# Patient Record
Sex: Female | Born: 1985 | Race: Black or African American | Hispanic: Refuse to answer | Marital: Single | Smoking: Current every day smoker
Health system: Northeastern US, Community
[De-identification: ages and names within clinical notes are randomized; demographics above are authoritative.]

## PROBLEM LIST (undated history)

## (undated) DIAGNOSIS — D649 Anemia, unspecified: Secondary | ICD-10-CM

## (undated) DIAGNOSIS — Z789 Other specified health status: Secondary | ICD-10-CM

## (undated) HISTORY — PX: NO PAST SURGERIES: SHX2092

---
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
## 2003-05-11 ENCOUNTER — Ambulatory Visit: Payer: Self-pay | Admitting: Adult Health

## 2003-07-06 ENCOUNTER — Encounter: Payer: Self-pay | Admitting: Adult Health

## 2004-04-08 ENCOUNTER — Ambulatory Visit: Payer: Self-pay | Admitting: Adult Health

## 2004-04-08 LAB — BLOOD COUNT COMPLETE AUTOMATED
HEMATOCRIT: 42.6 % (ref 37.0–47.0)
HEMOGLOBIN: 14.1 g/dL (ref 12.0–16.0)
MEAN CORP HGB CONC: 33.1 g/dL (ref 32.0–36.0)
MEAN CORPUSCULAR HGB: 27 pg (ref 27.0–31.0)
MEAN CORPUSCULAR VOL: 81.6 fL (ref 81.0–99.0)
MEAN PLATELET VOLUME: 8.7 fL (ref 6.4–10.8)
PLATELET COUNT: 248 10*3/uL (ref 150–400)
RBC DISTRIBUTION WIDTH: 13.3 % (ref 11.5–14.3)
RED BLOOD CELL COUNT: 5.22 MIL/uL (ref 4.20–5.40)
WHITE BLOOD CELL COUNT: 5.5 10*3/uL (ref 4.8–10.8)

## 2004-04-08 LAB — CYTOPATH, C/V, THIN LAYER

## 2004-04-09 LAB — CHG LIPID PANEL
Cholesterol: 148 mg/dl (ref 0–200)
HIGH DENSITY LIPOPROTEIN: 59 mg/dl (ref 35–85)
LOW DENSITY LIPOPROTEIN DIRECT: 69 mg/dl (ref 0–100)
RISK FACTOR: 2.5 (ref ?–4.4)
TRIGLYCERIDES: 34 mg/dl (ref 0–150)

## 2004-04-09 LAB — IADNA NEISSERIA GONORRHOEAE DIRECT PROBE TQ: GENPROBE GC: NEGATIVE

## 2004-04-09 LAB — IADNA CHLAMYDIA TRACHOMATIS DIRECT PROBE TQ: GENPROBE CHLAMYDIA: NEGATIVE

## 2004-04-09 LAB — RPR: RPR: NONREACTIVE

## 2004-06-13 ENCOUNTER — Ambulatory Visit: Payer: Self-pay | Admitting: Adult Health

## 2004-12-09 ENCOUNTER — Emergency Department: Payer: Self-pay

## 2005-07-24 ENCOUNTER — Ambulatory Visit: Payer: Self-pay | Admitting: Family Medicine

## 2005-07-24 ENCOUNTER — Telehealth (HOSPITAL_BASED_OUTPATIENT_CLINIC_OR_DEPARTMENT_OTHER): Payer: Self-pay

## 2005-07-24 ENCOUNTER — Encounter (HOSPITAL_BASED_OUTPATIENT_CLINIC_OR_DEPARTMENT_OTHER): Payer: Self-pay | Admitting: Family Medicine

## 2005-07-24 DIAGNOSIS — Z3040 Encounter for surveillance of contraceptives, unspecified: Secondary | ICD-10-CM

## 2005-07-24 DIAGNOSIS — Z113 Encounter for screening for infections with a predominantly sexual mode of transmission: Secondary | ICD-10-CM

## 2005-07-24 DIAGNOSIS — N760 Acute vaginitis: Secondary | ICD-10-CM

## 2005-07-24 NOTE — Telephone Encounter (Signed)
Please give 5 packages of Plan B to patient. Instructions and education done by Dr. Conley Canal. Patient will pick up this evening.

## 2005-07-25 LAB — CHLAMYDIA GC NAAT
GENPROBE CHLAMYDIA: NEGATIVE
GENPROBE GC: NEGATIVE

## 2005-08-04 NOTE — Telephone Encounter (Signed)
Please sign order

## 2005-08-08 LAB — CYTOPATH, C/V, THIN LAYER

## 2007-07-08 ENCOUNTER — Ambulatory Visit: Payer: Self-pay

## 2007-07-08 DIAGNOSIS — Z139 Encounter for screening, unspecified: Secondary | ICD-10-CM

## 2007-07-08 NOTE — Progress Notes (Signed)
Labs drawn

## 2007-07-09 NOTE — Progress Notes (Signed)
Pt arrives asking for immune status to be filled out for school.  Unable to document all that is required.  Immunes for MMR_Varicella-Heb b drawn.  Will make f/u appt for results,tb testing & PE

## 2007-07-13 ENCOUNTER — Encounter: Payer: Self-pay | Admitting: Family Medicine

## 2007-07-13 ENCOUNTER — Ambulatory Visit: Payer: Self-pay | Admitting: Family Medicine

## 2007-07-13 VITALS — BP 110/70 | HR 90 | Temp 96.8°F | Ht 65.0 in | Wt 226.0 lb

## 2007-07-13 DIAGNOSIS — Z111 Encounter for screening for respiratory tuberculosis: Secondary | ICD-10-CM

## 2007-07-13 DIAGNOSIS — Z1331 Encounter for screening for depression: Secondary | ICD-10-CM

## 2007-07-13 DIAGNOSIS — Z124 Encounter for screening for malignant neoplasm of cervix: Secondary | ICD-10-CM

## 2007-07-13 DIAGNOSIS — Z113 Encounter for screening for infections with a predominantly sexual mode of transmission: Secondary | ICD-10-CM

## 2007-07-13 DIAGNOSIS — Z0000 Encounter for general adult medical examination without abnormal findings: Secondary | ICD-10-CM

## 2007-07-13 DIAGNOSIS — L723 Sebaceous cyst: Secondary | ICD-10-CM

## 2007-07-13 DIAGNOSIS — L089 Local infection of the skin and subcutaneous tissue, unspecified: Secondary | ICD-10-CM

## 2007-07-13 MED ORDER — CEPHALEXIN 250 MG PO CAPS
ORAL_CAPSULE | ORAL | Status: AC
Start: 2007-07-13 — End: 2007-08-10

## 2007-07-13 NOTE — Progress Notes (Signed)
Patient stated that she feels safe at home.     Patient's method of birth control: condoms

## 2007-07-13 NOTE — Progress Notes (Signed)
SUBJECTIVE:  Cynthia Mcdonald is a 22 year old female who presents for a physical exam.       Awaiting lab results re need for any vaccinations- not all ready now.    ? Infected cyst upper abdomen for a long time- no pain/fever        Family History    Diabetes Maternal Grandmother    Heart Maternal Grandmother         Social History   Marital Status: Single  Spouse Name: N/A    Years of Education: 14  Number of Children: 0     Occupational History  Haematologist       Social History Main Topics   Tobacco Use: Yes     Comment: one pack a week    Alcohol Use: No    Drug Use: No    Sexually Active: Not Currently     Other Topics Concern    Exercise Yes    Comment: yoga 4 times a week     Social History Narrative    Grew in [place], grad [employer], lives with MGM, hopes to go to nursing school       No current outpatient prescriptions on file.    Obstetric History    G1  P0  T0  P0  A1  E0  M0  L0       REVIEW OF SYSTEMS:  No TIA's or unusual headaches, no dysphagia.  No prolonged cough. No dyspnea or chest pain on exertion.  No abdominal pain, change in bowel habits, black or bloody stools.  No urinary tract symptoms.  No new or unusual musculoskeletal symptoms.  Normal menses, no abnormal vaginal bleeding, discharge or unexpected pelvic pain. No new breast lumps, breast pain or nipple discharge.    OBJECTIVE:  The physical exam is generally normal. Patient appears well, alert and oriented x 3, pleasant, cooperative. Vitals are as noted. Neck supple and free of adenopathy, or masses. No thyromegaly. PERLA. Ears, throat are normal. Chest is clear to IPPA. Heart sounds are normal, no murmurs, clicks, gallops or rubs. Abdomen is soft, no tenderness, masses or organomegaly.  Breasts: normal without suspicious masses, skin or nipple changes or axillary nodes, self-exam is taught and encouraged. Self exam is encouraged. Pelvis: exam chaperoned by nurse, EGBUS within normal limits, normal cervix without lesions,  polyps or tenderness, uterus normal size, shape, consistency, no mass or tenderness, adnexa normal in size without mass or tenderness. Exam chaperoned. Extremities are normal. Peripheral pulses are normal. Screening neurological exam is normal without focal findings. Skin is normal without suspicious lesions noted ecept for chronic appearing cyst on upper abdomen, just below rib margin- draining small amounts of pus under presure, no erethema. Small cysts in skin surrounding    V70.0 Routine General Medical Examination at a Health Care Facility  (primary encounter diagnosis)  Comment: as above  Plan:  Needs past immun review    V74.1K Screening for Tuberculosis  Comment:  As needed  Plan: TB INTRADERMAL TEST            V76.2 Screening for Malignant Neoplasm of the Cervix  Comment: last done i 2007  Plan: CYTOPATH, C/V, THIN LAYER            V74.5A Screening for Venereal Disease  Comment: per routine  Plan: AMPLIFIED GENPROBE CHLAM/GC           V79.0 Screening for Depression  Comment:  PHQ-9-- 0  Plan:  reassured  706.2CD Infected Sebaceous Cyst  Comment:  Drained with local pressure  Plan: try Keflex for a week, may need excision in future

## 2007-07-13 NOTE — Progress Notes (Signed)
S:  Cynthia Mcdonald is here for a PPD plant.  1)  Has your arm ever turned red, bumpy, swollen or sore after receiving a PPD test for TB? No  2)  Have you ever had the disease Tuberculosis? No  3)  Have you traveled outside the country over the past 2 years? No  4)  Are you taking steroid medication? No  5)  Has there been any cancer or illnesses resulting in a decreased immune system recently? No  6)  Have you ever received BCG? No  6)  ROS of TB symptoms: None      O:  PPD planted.  0.1cc Mantox ID, left forearm.    A:  PPD plant, screen TB.    P:  Pt advised to return in 48-72 hours for appointment to interpret PPD results.       Scheduled in a nursing visit for follow up and documentation of completion.

## 2007-07-14 LAB — RUBEOLA IGG ANTIBODY: RUBEOLA IGG ANTIBODY: >4.5 — ABNORMAL HIGH (ref 0.0–0.8)

## 2007-07-14 LAB — VARICELLA ZOSTER IGG ANTIBODY: VARICELLA ZOSTER IGG ANTIBODY: >4.2 — ABNORMAL HIGH (ref 0.0–0.8)

## 2007-07-14 LAB — MUMPS IGG ANTIBODY: MUMPS IGG ANTIBODY: 3.5 — ABNORMAL HIGH (ref 0.0–0.8)

## 2007-07-14 LAB — HEPATITIS B SURFACE ANTIBODY: HEPATITIS B SURFACE ANTIBODY: REACTIVE

## 2007-07-14 LAB — HEPATITIS B SURFACE ANTIGEN: HEPATITIS B SURFACE ANTIGEN: NONREACTIVE

## 2007-07-14 LAB — RUBELLA IGG ANTIBODY: RUBELLA: 19 IU/mL — ABNORMAL HIGH (ref 0–15)

## 2007-07-15 ENCOUNTER — Ambulatory Visit: Payer: Self-pay | Admitting: Family Medicine

## 2007-07-15 DIAGNOSIS — Z111 Encounter for screening for respiratory tuberculosis: Secondary | ICD-10-CM

## 2007-07-15 LAB — SKIN TEST TUBERCULOSIS INTRADERMAL: PPD: 0

## 2007-07-15 NOTE — Progress Notes (Signed)
Cynthia Mcdonald is here for a PPD read.  PPD planted left forearm on 07/13/07.  No erthyema, 0mm induration left forearm. Negative PPD read.  F/u as scheduled with provider.

## 2007-07-16 ENCOUNTER — Encounter: Payer: Self-pay | Admitting: Family Medicine

## 2007-07-16 LAB — CHLAMYDIA GC NAAT
GENPROBE CHLAMYDIA: NEGATIVE
GENPROBE GC: NEGATIVE

## 2007-07-19 LAB — CYTOPATH, C/V, THIN LAYER

## 2007-09-24 ENCOUNTER — Ambulatory Visit (HOSPITAL_BASED_OUTPATIENT_CLINIC_OR_DEPARTMENT_OTHER): Payer: PRIVATE HEALTH INSURANCE | Admitting: "Women's Health Care

## 2007-09-24 ENCOUNTER — Ambulatory Visit (HOSPITAL_BASED_OUTPATIENT_CLINIC_OR_DEPARTMENT_OTHER): Payer: Self-pay | Admitting: Lab

## 2007-09-24 VITALS — BP 120/84

## 2007-09-24 DIAGNOSIS — IMO0001 Reserved for inherently not codable concepts without codable children: Secondary | ICD-10-CM

## 2007-09-24 NOTE — Progress Notes (Signed)
Pt was sent to [facility] by her "old high school, nurse".  She needs emergency contraception after condom broke 48hours ago.  Pt is due now for her period and in fact got period today.  She still wants plan B.  Declines family planning offer, she is not interested in the weight gain that goes along with most BCP.  She is aware of YAZ and its hopeful decrease in weight gain.  If interested she will call her PCP, Dr Jari Favre for [facility].  Will do pregnancy test if missses next menses as plan B is not 100%.

## 2007-12-15 ENCOUNTER — Encounter (HOSPITAL_BASED_OUTPATIENT_CLINIC_OR_DEPARTMENT_OTHER): Payer: Self-pay | Admitting: Family Medicine

## 2008-10-30 ENCOUNTER — Encounter (HOSPITAL_BASED_OUTPATIENT_CLINIC_OR_DEPARTMENT_OTHER): Payer: Self-pay

## 2008-10-30 ENCOUNTER — Ambulatory Visit (HOSPITAL_BASED_OUTPATIENT_CLINIC_OR_DEPARTMENT_OTHER): Payer: HMO

## 2008-10-30 VITALS — BP 110/80 | Wt 191.0 lb

## 2008-10-30 DIAGNOSIS — Z3009 Encounter for other general counseling and advice on contraception: Secondary | ICD-10-CM

## 2008-10-30 LAB — URINE PREGNANCY TEST (POINT OF CARE): HCG QUALITATIVE URINE: NEGATIVE

## 2008-10-30 MED ORDER — NEXT CHOICE 0.75 MG PO TABS
ORAL_TABLET | ORAL | Status: AC
Start: 2008-10-30 — End: 2008-10-30

## 2008-10-30 NOTE — Progress Notes (Signed)
.  Emergency Contraception Visit    History:           Date of last menstrual period: Patient's last menstrual period was 10/20/2008.                      Was it a normal period (flow, duration, time etc): Yes    Date and time of last unprotected intercourse: 10/28/2008 - condom broke.     Any unprotected intercourse more than 72 hours ago? No.                 Using any kind of contraception?  Yes. What types? condoms and n/a                       Urine pregnancy test(if done):  Negative    Eligible for [facility]?:  Yes    Education/Counseling Provided on:    -Emergency contraception (how it works, effectiveness, risks/benefits/side effects,  alternatives, instructions for use, warning signs of problems)  -Informed consent signed  -Written information on EC provided  -Contraception  -STD/HIV  prevention/safer sex and risk assessment  -Follow-up discussed (must be seen if): next period has not started within 3  weeks of EC, think might be pregnant, if abdominal pain (EC does not prevent ectopic pregnancy)    Other comments/plans:  Pt is here for Plan B after condom broke during intercourse on 10/28/2008. Pt and partner of 2 years use condoms during every sexual act. Pt also interested in discussing other forms of BC but requests only low or no hormone forms. Pt has previosuly used Depo and OCP with hormonal side effects of weight gain and severe mood swings. Counselor discussed IUD, NuvaRing and low dose OCP. Pt is interested in Mirena IUD.     DV assessment completed and pt reports being very happy and safe in relationship with no abuse, fear or coercion.     Counselor discussed plan with Dr. Zonia Kief who OK'd distribution of two packs of Next Choice Plan B. 1 for use today and one for future. Pt signed Family Planning and EC consent forms today. Written information and condoms distributed along with clinic numbers. Marland Kitchen       Thea Gist                     FP Counselor Signature

## 2013-10-11 ENCOUNTER — Encounter: Payer: Self-pay | Admitting: Family Medicine

## 2017-03-13 ENCOUNTER — Encounter (HOSPITAL_BASED_OUTPATIENT_CLINIC_OR_DEPARTMENT_OTHER): Payer: Self-pay

## 2017-03-13 ENCOUNTER — Emergency Department (HOSPITAL_BASED_OUTPATIENT_CLINIC_OR_DEPARTMENT_OTHER)
Admission: RE | Admit: 2017-03-13 | Disposition: A | Discharge status: Home / Self Care | Payer: Self-pay | Source: Emergency Department | Attending: Emergency Medicine | Admitting: Emergency Medicine

## 2017-03-13 MED ORDER — PENICILLIN V POTASSIUM 500 MG PO TABS: 500 mg | tablet | Freq: Three times a day (TID) | ORAL | 0 refills | 0 days | Status: AC

## 2017-03-13 MED ORDER — ACETAMINOPHEN 500 MG PO TABS
1000.0000 mg | ORAL_TABLET | Freq: Once | ORAL | Status: AC
Start: 2017-03-13 — End: 2017-03-13
  Administered 2017-03-13: 1000 mg via ORAL

## 2017-03-13 MED ORDER — PENICILLIN V POTASSIUM 500 MG PO TABS
500.0000 mg | ORAL_TABLET | Freq: Once | ORAL | Status: AC
  Administered 2017-03-13: 500 mg via ORAL
  Filled 2017-03-13: qty 1

## 2017-03-13 MED ORDER — PENICILLIN V POTASSIUM 500 MG PO TABS
500.00 mg | ORAL_TABLET | Freq: Three times a day (TID) | ORAL | 0 refills | Status: AC
Start: 2017-03-13 — End: 2017-03-20

## 2017-03-13 MED ORDER — ACETAMINOPHEN 500 MG PO TABS
ORAL_TABLET | ORAL | Status: DC
Start: 2017-03-13 — End: 2017-03-14
  Filled 2017-03-13: qty 2

## 2017-03-13 NOTE — ED Provider Notes (Signed)
ED nursing record was reviewed. Prior records as available electronically through the Epic record were reviewed.      HPI:    This 31 year old female patient presents to the Emergency Department with chief complaint of tooth pain and facial swelling.  For the past few weeks patient has had pain in her right lower jaw.  She has seen 3 dentist which told her that she needed to see a different specialist to extract her tooth.  Earlier today she started noticing some swelling.  No fevers.  She took Aleve 6 hours ago.  No other complaints.      ROS: Pertinent positives were reviewed as per the HPI above. All other systems were reviewed and are negative.      Past Medical History/Problem list:  History reviewed. No pertinent past medical history.  Patient Active Problem List:     Infected sebaceous cyst     Family planning              Past Surgical History: History reviewed. No pertinent surgical history.      Medications:   No current facility-administered medications on file prior to encounter.   No current outpatient medications on file prior to encounter.      Social History:   Social History     Socioeconomic History    Marital status: Single     Spouse name: Not on file    Number of children: 0    Years of education: 14    Highest education level: Not on file   Social Needs    Financial resource strain: Not on file    Food insecurity - worry: Not on file    Food insecurity - inability: Not on file    Transportation needs - medical: Not on file    Transportation needs - non-medical: Not on file   Occupational History    Occupation: medical  assitant student     Employer: [employer]   Tobacco Use    Smoking status: Current Every Day Smoker    Tobacco comment: one pack a week   Substance and Sexual Activity    Alcohol use: No    Drug use: No    Sexual activity: Not Currently   Other Topics Concern    Military Service Not Asked    Blood Transfusions Not Asked    Caffeine Concern Not Asked     Occupational Exposure Not Asked    Hobby Hazards Not Asked    Sleep Concern Not Asked    Stress Concern Not Asked    Weight Concern Not Asked    Special Diet Not Asked    Back Care Not Asked    Exercise Yes     Comment: yoga 4 times a week    Bike Helmet Not Asked    Seat Belt Not Asked    Self-Exams Not Asked   Social History Narrative    Grew in [place], grad [employer], lives with MGM, hopes to go to nursing school           Allergies:  Review of Patient's Allergies indicates:  No Known Allergies      Physical Exam:  BP 148/63  Pulse 86  Temp 97.8 F  Resp 17  Wt 110.2 kg (243 lb)  LMP 02/27/2017  SpO2 99%  BMI 40.44 kg/m2    GENERAL: No acute distress, non-toxic.   SKIN:  Warm & Dry, no rash.  HEAD:  NCAT. Sclerae are anicteric and  aninjected.  Mouth: Tenderness and mild swelling in right lower gums.  No palpable abscess     NECK:  Supple.  No meningismus.    LUNGS:  Clear to auscultation bilaterally.   HEART:  RRR.   ABDOMEN:  Soft, NTND.  No involuntary guarding or rebound.   EXTREMITIES:  No obvious deformities.   NEUROLOGIC:  Alert, moves all extremities well; speaking clearly.   PSYCHIATRIC:  Appropriate for age, time of day, and situation        ED Course and Medical Decision-making:  This 31 year old female patient presents with pain and swelling in her right jaw.  Likely dental infection.  No obvious abscess to drain.  She was given a dose of penicillin and a prescription.  She was encouraged to follow-up with a dentist as soon as possible.  Reasons to return were discussed.      Condition: Stable  Disposition: discharge      Diagnosis/Diagnoses:  Dental abscess    Marylouise Stacks, MD

## 2017-03-13 NOTE — ED Triage Note (Signed)
Patient presents to the ER complaining of right lower dental pain. "It's my back tooth." "It's been going on for two weeks." patient states she still has her wisdom teeth. Some right sided facial swelling noted. No acute distress.

## 2017-03-13 NOTE — Narrator Note (Signed)
Patient examined by MD. Patient medicated per MD order. Patient discharged and was given script and discharge instructions. Patient left to care of self with no new complaints.

## 2017-03-13 NOTE — Narrator Note (Signed)
Patient Disposition    Patient education for diagnosis, medications, activity, diet and follow-up.  Patient left ED 10:51 PM.  Patient rep received written instructions.  Interpreter to provide instructions: No    Patient belongings with patient: YES    Have all existing LDAs been addressed? N/A    Have all IV infusions been stopped? N/A    Discharged to: Discharged to home.

## 2018-04-20 DIAGNOSIS — Z6841 Body Mass Index (BMI) 40.0 and over, adult: Secondary | ICD-10-CM

## 2019-05-27 NOTE — L&D Delivery Note (Addendum)
LABOR COURSE 34 yo G2P0010 @ [redacted]w[redacted]d presenting for SOL. SROM @ 0315 light meconium. Patient augmented with pitocin and progressed to complete without complication.   Delivery Note Called to room and patient was complete and pushing. Head delivered Middle Occiput Anterior. No nuchal cord present. Shoulder and body delivered in usual fashion. At 1536 a healthy female was delivered via Vaginal, Spontaneous.  Infant with spontaneous cry, placed on mother's abdomen, dried and stimulated. Cord clamped x 2 after 1-minute delay, and cut by FOB. Cord blood drawn. Placenta delivered spontaneously with gentle cord traction. Appears intact. Fundus firm with massage and Pitocin. Labia, perineum, vagina, and cervix inspected with 2nd degree perineal laceration. Repaired with 3.0 vicryl.  APGAR:9,9 ; weight 3385g .   Cord: 3VC, membranes intact.    Anesthesia: Epidural Episiotomy: None Lacerations: 2nd degre Suture Repair: 3.0 vicryl Est. Blood Loss (mL): 300  Mom to postpartum.  Baby to Couplet care / Skin to Skin.  Betti Cruz, MD PGY-1 05/01/2020 5:50 PM     GME ATTESTATION:  I saw and evaluated the patient, I was gowned and gloved during the entirety of delivery. I agree with the findings and the plan of care as documented in the resident's note.  Alric Seton, MD OB Fellow, Faculty [facility], Center for [facility] 05/01/2020 6:31 PM

## 2019-10-19 ENCOUNTER — Ambulatory Visit (INDEPENDENT_AMBULATORY_CARE_PROVIDER_SITE_OTHER): Payer: Medicaid Other

## 2019-10-19 ENCOUNTER — Other Ambulatory Visit: Payer: Self-pay

## 2019-10-19 VITALS — BP 103/68 | HR 73 | Ht 66.0 in | Wt 223.0 lb

## 2019-10-19 DIAGNOSIS — Z3201 Encounter for pregnancy test, result positive: Secondary | ICD-10-CM

## 2019-10-19 DIAGNOSIS — Z3490 Encounter for supervision of normal pregnancy, unspecified, unspecified trimester: Secondary | ICD-10-CM | POA: Insufficient documentation

## 2019-10-19 LAB — POCT URINE PREGNANCY: Preg Test, Ur: POSITIVE — AB

## 2019-10-19 MED ORDER — BLOOD PRESSURE KIT DEVI
1.0000 | 0 refills | Status: DC
Start: 2019-10-19 — End: 2020-10-04

## 2019-10-19 NOTE — Progress Notes (Signed)
Traci Ramirez presents today for UPT. She has no unusual complaints.  LMP: 07/25/2019  EDD: 04/30/2020  [redacted]w[redacted]d    OBJECTIVE: Appears well, in no apparent distress.  OB History    Gravida  1   Para      Term      Preterm      AB      Living        SAB      TAB      Ectopic      Multiple      Live Births             Home UPT Result: POSITIVE In-Office UPT result: POSITIVE  I have reviewed the patient's medical, obstetrical, social, and family histories, and medications.   ASSESSMENT: Positive pregnancy test  PLAN Prenatal care to be completed at: [facility]

## 2019-11-01 ENCOUNTER — Ambulatory Visit (HOSPITAL_BASED_OUTPATIENT_CLINIC_OR_DEPARTMENT_OTHER): Payer: Medicaid Other

## 2019-11-01 DIAGNOSIS — Z3490 Encounter for supervision of normal pregnancy, unspecified, unspecified trimester: Secondary | ICD-10-CM

## 2019-11-01 DIAGNOSIS — Z3A00 Weeks of gestation of pregnancy not specified: Secondary | ICD-10-CM | POA: Diagnosis not present

## 2019-11-01 NOTE — Progress Notes (Signed)
I connected with Traci Ramirez on 11/01/19 at  9:00 AM EDT by a telephone enabled telemedicine application and verified that I am speaking with the correct person using two identifiers.    I discussed the limitations of evaluation and management by telemedicine and the availability of in person appointments. The patient expressed understanding and agreed to proceed.  OB History  Gravida Para Term Preterm AB Living  2       1    SAB TAB Ectopic Multiple Live Births  1            # Outcome Date GA Lbr Len/2nd Weight Sex Delivery Anes PTL Lv  2 Current           1 SAB 02/23/19            Assessment and Plan:  Normal Pregnacy   Follow Up Instructions:  11/08/19  I provided 10 minutes of non-face-to-face time during this encounter.

## 2019-11-01 NOTE — Progress Notes (Signed)
I have reviewed this chart and agree with the RN/CMA assessment and management.    K. Meryl Nixon Sparr, M.D. Attending [facility] (Faculty Practice)   

## 2019-11-08 ENCOUNTER — Other Ambulatory Visit: Payer: Self-pay

## 2019-11-08 ENCOUNTER — Encounter: Payer: Self-pay | Admitting: Obstetrics & Gynecology

## 2019-11-08 ENCOUNTER — Ambulatory Visit (INDEPENDENT_AMBULATORY_CARE_PROVIDER_SITE_OTHER): Payer: Medicaid Other | Admitting: Obstetrics & Gynecology

## 2019-11-08 ENCOUNTER — Other Ambulatory Visit (HOSPITAL_COMMUNITY)
Admission: RE | Admit: 2019-11-08 | Discharge: 2019-11-08 | Disposition: A | Discharge status: Home / Self Care | Payer: Medicaid Other | Source: Ambulatory Visit | Attending: Obstetrics & Gynecology | Admitting: Obstetrics & Gynecology

## 2019-11-08 DIAGNOSIS — O09529 Supervision of elderly multigravida, unspecified trimester: Secondary | ICD-10-CM

## 2019-11-08 DIAGNOSIS — Z3A15 15 weeks gestation of pregnancy: Secondary | ICD-10-CM

## 2019-11-08 DIAGNOSIS — Z3490 Encounter for supervision of normal pregnancy, unspecified, unspecified trimester: Secondary | ICD-10-CM | POA: Insufficient documentation

## 2019-11-08 DIAGNOSIS — O09522 Supervision of elderly multigravida, second trimester: Secondary | ICD-10-CM

## 2019-11-08 DIAGNOSIS — O99212 Obesity complicating pregnancy, second trimester: Secondary | ICD-10-CM

## 2019-11-08 DIAGNOSIS — Z3402 Encounter for supervision of normal first pregnancy, second trimester: Secondary | ICD-10-CM

## 2019-11-08 DIAGNOSIS — E669 Obesity, unspecified: Secondary | ICD-10-CM | POA: Diagnosis not present

## 2019-11-08 NOTE — Progress Notes (Signed)
  Subjective:    Traci Ramirez is a G2P0010 [redacted]w[redacted]d being seen today for her first obstetrical visit.  Her obstetrical history is significant for advanced maternal age, excessive weight gain and obesity. Patient does intend to breast feed. Pregnancy history fully reviewed.  Patient reports no complaints and intermittent cramping.  Vitals:   11/08/19 1025  BP: 117/75  Pulse: (!) 101  Weight: 233 lb (105.7 kg)    HISTORY: OB History  Gravida Para Term Preterm AB Living  2       1    SAB TAB Ectopic Multiple Live Births  1            # Outcome Date GA Lbr Len/2nd Weight Sex Delivery Anes PTL Lv  2 Current           1 SAB 02/23/19           History reviewed. No pertinent past medical history. History reviewed. No pertinent surgical history. Family History  Problem Relation Age of Onset  . Lung cancer Mother   . Brain cancer Mother      Exam     Skin: normal coloration and turgor, no rashes    Neurologic: oriented, normal, negative   Extremities: normal strength, tone, and muscle mass   HEENT PERRLA   Neck no masses   Cardiovascular: regular rate and rhythm   Respiratory:  appears well, vitals normal, no respiratory distress, acyanotic, normal RR, ear and throat exam is normal, neck free of mass or lymphadenopathy, chest clear, no wheezing, crepitations, rhonchi, normal symmetric air entry   Abdomen: soft, non-tender; bowel sounds normal; no masses,  no organomegaly   Urinary: urethral meatus normal      Assessment:    Pregnancy: G2P0010 Patient Active Problem List   Diagnosis Date Noted  . Supervision of normal pregnancy, antepartum 10/19/2019        Plan:     Initial labs drawn. Prenatal vitamins. Problem list reviewed and updated. Genetic Screening discussed Quad Screen: requested.  Ultrasound discussed; fetal survey: requested.  Follow up in 4 weeks. 80% of 15 min visit spent on counseling and coordination of care.  Obesity- Hg A1C, ordered, started  on ASA 81mg .  AMA- Level II anatomy U/S at 18-20 weeks  11/08/2019

## 2019-11-08 NOTE — Patient Instructions (Signed)

## 2019-11-09 LAB — OBSTETRIC PANEL, INCLUDING HIV
Antibody Screen: NEGATIVE
Basophils Absolute: 0 10*3/uL (ref 0.0–0.2)
Basos: 0 %
EOS (ABSOLUTE): 0.1 10*3/uL (ref 0.0–0.4)
Eos: 2 %
HIV Screen 4th Generation wRfx: NONREACTIVE
Hematocrit: 34.2 % (ref 34.0–46.6)
Hemoglobin: 10.4 g/dL — ABNORMAL LOW (ref 11.1–15.9)
Hepatitis B Surface Ag: NEGATIVE
Immature Grans (Abs): 0 10*3/uL (ref 0.0–0.1)
Immature Granulocytes: 0 %
Lymphocytes Absolute: 1.4 10*3/uL (ref 0.7–3.1)
Lymphs: 28 %
MCH: 22.3 pg — ABNORMAL LOW (ref 26.6–33.0)
MCHC: 30.4 g/dL — ABNORMAL LOW (ref 31.5–35.7)
MCV: 73 fL — ABNORMAL LOW (ref 79–97)
Monocytes Absolute: 0.4 10*3/uL (ref 0.1–0.9)
Monocytes: 7 %
Neutrophils Absolute: 3.3 10*3/uL (ref 1.4–7.0)
Neutrophils: 63 %
Platelets: 285 10*3/uL (ref 150–450)
RBC: 4.67 x10E6/uL (ref 3.77–5.28)
RDW: 21.4 % — ABNORMAL HIGH (ref 11.7–15.4)
RPR Ser Ql: NONREACTIVE
Rh Factor: POSITIVE
Rubella Antibodies, IGG: 1.35 index (ref >0.99)
WBC: 5.2 10*3/uL (ref 3.4–10.8)

## 2019-11-09 LAB — CERVICOVAGINAL ANCILLARY ONLY
Bacterial Vaginitis (gardnerella): NEGATIVE
Candida Glabrata: NEGATIVE
Candida Vaginitis: POSITIVE — AB
Chlamydia: NEGATIVE
Comment: NEGATIVE
Comment: NEGATIVE
Comment: NEGATIVE
Comment: NEGATIVE
Comment: NEGATIVE
Comment: NORMAL
Neisseria Gonorrhea: NEGATIVE
Trichomonas: NEGATIVE

## 2019-11-09 LAB — HEMOGLOBIN A1C
Est. average glucose Bld gHb Est-mCnc: 103 mg/dL
Hgb A1c MFr Bld: 5.2 % (ref 4.8–5.6)

## 2019-11-10 ENCOUNTER — Telehealth (INDEPENDENT_AMBULATORY_CARE_PROVIDER_SITE_OTHER): Payer: Medicaid Other | Admitting: Lactation Services

## 2019-11-10 ENCOUNTER — Other Ambulatory Visit (HOSPITAL_COMMUNITY): Payer: Self-pay | Admitting: Obstetrics & Gynecology

## 2019-11-10 DIAGNOSIS — B373 Candidiasis of vulva and vagina: Secondary | ICD-10-CM

## 2019-11-10 DIAGNOSIS — B3731 Acute candidiasis of vulva and vagina: Secondary | ICD-10-CM

## 2019-11-10 LAB — CULTURE, OB URINE

## 2019-11-10 LAB — URINE CULTURE, OB REFLEX

## 2019-11-10 MED ORDER — FLUCONAZOLE 150 MG PO TABS
150.0000 mg | ORAL_TABLET | ORAL | 1 refills | Status: AC
Start: 2019-11-10 — End: 2019-11-14

## 2019-11-10 NOTE — Telephone Encounter (Signed)
Called patient to give results and to let her know that Diflucan has been sent to her pharmacy. Patient voiced understanding.

## 2019-11-10 NOTE — Telephone Encounter (Signed)
-----   Message from Malachy Chamber, MD sent at 11/10/2019 12:53 AM EDT ----- Diflucan sent to pharmacy for yeast infection

## 2019-11-15 ENCOUNTER — Encounter: Payer: Self-pay | Admitting: Obstetrics and Gynecology

## 2019-11-16 ENCOUNTER — Encounter: Payer: Self-pay | Admitting: Obstetrics and Gynecology

## 2019-12-06 ENCOUNTER — Encounter: Payer: Medicaid Other | Admitting: Obstetrics and Gynecology

## 2019-12-07 ENCOUNTER — Ambulatory Visit: Payer: Medicaid Other

## 2019-12-08 ENCOUNTER — Encounter: Payer: Self-pay | Admitting: Obstetrics and Gynecology

## 2019-12-08 DIAGNOSIS — D563 Thalassemia minor: Secondary | ICD-10-CM | POA: Insufficient documentation

## 2019-12-09 ENCOUNTER — Ambulatory Visit (INDEPENDENT_AMBULATORY_CARE_PROVIDER_SITE_OTHER): Payer: Medicaid Other | Admitting: Obstetrics and Gynecology

## 2019-12-09 ENCOUNTER — Other Ambulatory Visit: Payer: Self-pay

## 2019-12-09 ENCOUNTER — Ambulatory Visit: Payer: Medicaid Other | Admitting: *Deleted

## 2019-12-09 ENCOUNTER — Other Ambulatory Visit: Payer: Self-pay | Admitting: *Deleted

## 2019-12-09 ENCOUNTER — Ambulatory Visit: Payer: Medicaid Other | Attending: Obstetrics and Gynecology

## 2019-12-09 ENCOUNTER — Encounter: Payer: Self-pay | Admitting: Obstetrics and Gynecology

## 2019-12-09 VITALS — BP 125/78 | HR 93 | Wt 231.5 lb

## 2019-12-09 DIAGNOSIS — O99210 Obesity complicating pregnancy, unspecified trimester: Secondary | ICD-10-CM | POA: Insufficient documentation

## 2019-12-09 DIAGNOSIS — O09519 Supervision of elderly primigravida, unspecified trimester: Secondary | ICD-10-CM | POA: Insufficient documentation

## 2019-12-09 DIAGNOSIS — Z3400 Encounter for supervision of normal first pregnancy, unspecified trimester: Secondary | ICD-10-CM

## 2019-12-09 DIAGNOSIS — Z3490 Encounter for supervision of normal pregnancy, unspecified, unspecified trimester: Secondary | ICD-10-CM

## 2019-12-09 DIAGNOSIS — O09512 Supervision of elderly primigravida, second trimester: Secondary | ICD-10-CM | POA: Insufficient documentation

## 2019-12-09 DIAGNOSIS — Z3A19 19 weeks gestation of pregnancy: Secondary | ICD-10-CM

## 2019-12-09 DIAGNOSIS — Z3482 Encounter for supervision of other normal pregnancy, second trimester: Secondary | ICD-10-CM | POA: Diagnosis not present

## 2019-12-09 DIAGNOSIS — D563 Thalassemia minor: Secondary | ICD-10-CM

## 2019-12-09 DIAGNOSIS — Z148 Genetic carrier of other disease: Secondary | ICD-10-CM | POA: Diagnosis not present

## 2019-12-09 DIAGNOSIS — O99212 Obesity complicating pregnancy, second trimester: Secondary | ICD-10-CM

## 2019-12-09 DIAGNOSIS — O09522 Supervision of elderly multigravida, second trimester: Secondary | ICD-10-CM

## 2019-12-09 DIAGNOSIS — O99012 Anemia complicating pregnancy, second trimester: Secondary | ICD-10-CM

## 2019-12-09 DIAGNOSIS — Z362 Encounter for other antenatal screening follow-up: Secondary | ICD-10-CM

## 2019-12-09 MED ORDER — ASPIRIN EC 81 MG PO TBEC
81.0000 mg | DELAYED_RELEASE_TABLET | Freq: Every day | ORAL | 2 refills | Status: DC
Start: 2019-12-09 — End: 2020-04-29

## 2019-12-09 NOTE — Progress Notes (Signed)
   PRENATAL VISIT NOTE  Subjective:  Traci Ramirez is a 34 y.o. G2P0010 at [redacted]w[redacted]d being seen today for ongoing prenatal care.  She is currently monitored for the following issues for this high-risk pregnancy and has Supervision of normal pregnancy, antepartum; Alpha thalassemia silent carrier; and Maternal obesity affecting pregnancy, antepartum on their problem list.  Patient reports no complaints.  Contractions: Not present. Vag. Bleeding: None.  Movement: Present. Denies leaking of fluid.   The following portions of the patient's history were reviewed and updated as appropriate: allergies, current medications, past family history, past medical history, past social history, past surgical history and problem list.   Objective:   Vitals:   12/09/19 0818  BP: 125/78  Pulse: 93  Weight: 231 lb 8 oz (105 kg)    Fetal Status: Fetal Heart Rate (bpm): 148   Movement: Present     General:  Alert, oriented and cooperative. Patient is in no acute distress.  Skin: Skin is warm and dry. No rash noted.   Cardiovascular: Normal heart rate noted  Respiratory: Normal respiratory effort, no problems with respiration noted  Abdomen: Soft, gravid, appropriate for gestational age.  Pain/Pressure: Absent     Pelvic: Cervical exam deferred        Extremities: Normal range of motion.  Edema: None  Mental Status: Normal mood and affect. Normal behavior. Normal judgment and thought content.   Assessment and Plan:  Pregnancy: G2P0010 at [redacted]w[redacted]d 1. Supervision of normal first pregnancy, antepartum Patient is doing well without complaints Anatomy ultrasound today Referred to genetic counseling  2. Maternal obesity affecting pregnancy, antepartum Rx ASA provided  Preterm labor symptoms and general obstetric precautions including but not limited to vaginal bleeding, contractions, leaking of fluid and fetal movement were reviewed in detail with the patient. Please refer to After Visit Summary for other  counseling recommendations.   Return in about 4 weeks (around 01/06/2020) for in person, ROB, Low risk.  Future Appointments  Date Time Provider Department Center  12/09/2019 12:45 PM [facility] NURSE [facility]  12/09/2019  1:00 PM [facility] US1 [facility]    Catalina Antigua, MD

## 2020-01-06 ENCOUNTER — Ambulatory Visit: Payer: Medicaid Other

## 2020-01-06 ENCOUNTER — Ambulatory Visit (INDEPENDENT_AMBULATORY_CARE_PROVIDER_SITE_OTHER): Payer: Medicaid Other | Admitting: Obstetrics and Gynecology

## 2020-01-06 ENCOUNTER — Other Ambulatory Visit: Payer: Self-pay

## 2020-01-06 ENCOUNTER — Encounter: Payer: Self-pay | Admitting: Obstetrics and Gynecology

## 2020-01-06 VITALS — BP 108/72 | HR 89 | Wt 239.0 lb

## 2020-01-06 DIAGNOSIS — O99212 Obesity complicating pregnancy, second trimester: Secondary | ICD-10-CM

## 2020-01-06 DIAGNOSIS — Z148 Genetic carrier of other disease: Secondary | ICD-10-CM

## 2020-01-06 DIAGNOSIS — Z3490 Encounter for supervision of normal pregnancy, unspecified, unspecified trimester: Secondary | ICD-10-CM

## 2020-01-06 DIAGNOSIS — Z7189 Other specified counseling: Secondary | ICD-10-CM | POA: Diagnosis not present

## 2020-01-06 DIAGNOSIS — E669 Obesity, unspecified: Secondary | ICD-10-CM

## 2020-01-06 DIAGNOSIS — O99210 Obesity complicating pregnancy, unspecified trimester: Secondary | ICD-10-CM

## 2020-01-06 DIAGNOSIS — Z3A23 23 weeks gestation of pregnancy: Secondary | ICD-10-CM

## 2020-01-06 DIAGNOSIS — D563 Thalassemia minor: Secondary | ICD-10-CM

## 2020-01-06 DIAGNOSIS — O09512 Supervision of elderly primigravida, second trimester: Secondary | ICD-10-CM

## 2020-01-06 NOTE — Progress Notes (Signed)
   PRENATAL VISIT NOTE  Subjective:  Traci Ramirez is a 34 y.o. G2P0010 at [redacted]w[redacted]d being seen today for ongoing prenatal care.  She is currently monitored for the following issues for this high-risk pregnancy and has Supervision of normal pregnancy, antepartum; Alpha thalassemia silent carrier; Maternal obesity affecting pregnancy, antepartum; and AMA (advanced maternal age) primigravida 35+ on their problem list.  Patient reports stretching feeling.  Contractions: Not present. Vag. Bleeding: None.  Movement: Present. Denies leaking of fluid.   The following portions of the patient's history were reviewed and updated as appropriate: allergies, current medications, past family history, past medical history, past social history, past surgical history and problem list.   Objective:   Vitals:   01/06/20 0847  BP: 108/72  Pulse: 89  Weight: 239 lb (108.4 kg)    Fetal Status: Fetal Heart Rate (bpm): 144   Movement: Present     General:  Alert, oriented and cooperative. Patient is in no acute distress.  Skin: Skin is warm and dry. No rash noted.   Cardiovascular: Normal heart rate noted  Respiratory: Normal respiratory effort, no problems with respiration noted  Abdomen: Soft, gravid, appropriate for gestational age.  Pain/Pressure: Absent     Pelvic: Cervical exam deferred        Extremities: Normal range of motion.  Edema: None  Mental Status: Normal mood and affect. Normal behavior. Normal judgment and thought content.   Assessment and Plan:  Pregnancy: G2P0010 at [redacted]w[redacted]d  1. Encounter for supervision of normal pregnancy, antepartum, unspecified gravidity  2. Maternal obesity affecting pregnancy, antepartum Cont baby ASA  3. Primigravida of advanced maternal age in second trimester  4. Alpha thalassemia silent carrier  5. [redacted] weeks gestation of pregnancy  6. Counseled about COVID-19 virus infection The patient was counseled on the potential benefits and lack of known risks of  COVID vaccination, during pregnancy and breastfeeding, on today's visit. The patient's questions and concerns were addressed today, including nothing specific. The patient is planning to get vaccinated. The patient is aware that if she chooses not to get an employee mandated vaccination we will provide documentation for her emplo yers in the form of a letter unless a specific exemption form is submitted to the provider.    Preterm labor symptoms and general obstetric precautions including but not limited to vaginal bleeding, contractions, leaking of fluid and fetal movement were reviewed in detail with the patient. Please refer to After Visit Summary for other counseling recommendations.   Return in about 4 weeks (around 02/03/2020) for 2 hr GTT, 3rd trim labs.  Future Appointments  Date Time Provider Department Center  01/10/2020 10:30 AM [facility] NURSE [facility]  01/10/2020 10:45 AM [facility] US4 [facility]  02/03/2020  9:00 AM [facility] LAB [facility] None  02/03/2020  9:15 AM Warden Fillers, MD [facility] None    Conan Bowens, MD

## 2020-01-10 ENCOUNTER — Ambulatory Visit: Payer: Medicaid Other | Admitting: *Deleted

## 2020-01-10 ENCOUNTER — Other Ambulatory Visit: Payer: Self-pay

## 2020-01-10 ENCOUNTER — Ambulatory Visit: Payer: Medicaid Other | Attending: Obstetrics and Gynecology

## 2020-01-10 DIAGNOSIS — O99210 Obesity complicating pregnancy, unspecified trimester: Secondary | ICD-10-CM

## 2020-01-10 DIAGNOSIS — O09512 Supervision of elderly primigravida, second trimester: Secondary | ICD-10-CM | POA: Insufficient documentation

## 2020-01-10 DIAGNOSIS — Z3490 Encounter for supervision of normal pregnancy, unspecified, unspecified trimester: Secondary | ICD-10-CM

## 2020-01-10 DIAGNOSIS — E669 Obesity, unspecified: Secondary | ICD-10-CM

## 2020-01-10 DIAGNOSIS — Z148 Genetic carrier of other disease: Secondary | ICD-10-CM

## 2020-01-10 DIAGNOSIS — O09522 Supervision of elderly multigravida, second trimester: Secondary | ICD-10-CM

## 2020-01-10 DIAGNOSIS — Z3A24 24 weeks gestation of pregnancy: Secondary | ICD-10-CM

## 2020-01-10 DIAGNOSIS — Z362 Encounter for other antenatal screening follow-up: Secondary | ICD-10-CM | POA: Insufficient documentation

## 2020-01-10 DIAGNOSIS — O99212 Obesity complicating pregnancy, second trimester: Secondary | ICD-10-CM | POA: Diagnosis not present

## 2020-02-03 ENCOUNTER — Ambulatory Visit (INDEPENDENT_AMBULATORY_CARE_PROVIDER_SITE_OTHER): Payer: Medicaid Other | Admitting: Obstetrics and Gynecology

## 2020-02-03 ENCOUNTER — Other Ambulatory Visit: Payer: Self-pay

## 2020-02-03 ENCOUNTER — Other Ambulatory Visit: Payer: Medicaid Other

## 2020-02-03 VITALS — BP 125/79 | HR 98 | Wt 242.8 lb

## 2020-02-03 DIAGNOSIS — D563 Thalassemia minor: Secondary | ICD-10-CM

## 2020-02-03 DIAGNOSIS — O99210 Obesity complicating pregnancy, unspecified trimester: Secondary | ICD-10-CM

## 2020-02-03 DIAGNOSIS — Z23 Encounter for immunization: Secondary | ICD-10-CM

## 2020-02-03 DIAGNOSIS — Z3490 Encounter for supervision of normal pregnancy, unspecified, unspecified trimester: Secondary | ICD-10-CM

## 2020-02-03 DIAGNOSIS — O09512 Supervision of elderly primigravida, second trimester: Secondary | ICD-10-CM

## 2020-02-03 DIAGNOSIS — Z3A27 27 weeks gestation of pregnancy: Secondary | ICD-10-CM | POA: Insufficient documentation

## 2020-02-03 NOTE — Progress Notes (Signed)
   PRENATAL VISIT NOTE  Subjective:  Traci Ramirez is a 34 y.o. G2P0010 at [redacted]w[redacted]d being seen today for ongoing prenatal care.  She is currently monitored for the following issues for this low-risk pregnancy and has Supervision of normal pregnancy, antepartum; Alpha thalassemia silent carrier; Maternal obesity affecting pregnancy, antepartum; and AMA (advanced maternal age) primigravida 35+ on their problem list.  Patient doing well with no acute concerns today. She reports no complaints.  Contractions: Not present. Vag. Bleeding: None.  Movement: Present. Denies leaking of fluid.   The following portions of the patient's history were reviewed and updated as appropriate: allergies, current medications, past family history, past medical history, past social history, past surgical history and problem list. Problem list updated.  Objective:   Vitals:   02/03/20 0923  BP: 125/79  Pulse: 98  Weight: 242 lb 12.8 oz (110.1 kg)    Fetal Status: Fetal Heart Rate (bpm): 150   Movement: Present     General:  Alert, oriented and cooperative. Patient is in no acute distress.  Skin: Skin is warm and dry. No rash noted.   Cardiovascular: Normal heart rate noted  Respiratory: Normal respiratory effort, no problems with respiration noted  Abdomen: Soft, gravid, appropriate for gestational age.  Pain/Pressure: Absent     Pelvic: Cervical exam deferred        Extremities: Normal range of motion.  Edema: None  Mental Status:  Normal mood and affect. Normal behavior. Normal judgment and thought content.   Assessment and Plan:  Pregnancy: G2P0010 at [redacted]w[redacted]d  1. Encounter for supervision of normal pregnancy, antepartum, unspecified gravidity  - Glucose Tolerance, 2 Hours w/1 Hour - CBC - RPR - HIV Antibody (routine testing w rflx) - Tdap vaccine greater than or equal to 7yo IM  2. Alpha thalassemia silent carrier   3. Maternal obesity affecting pregnancy, antepartum   4. Primigravida of advanced  maternal age in second trimester  5. 27 weeks  Preterm labor symptoms and general obstetric precautions including but not limited to vaginal bleeding, contractions, leaking of fluid and fetal movement were reviewed in detail with the patient.  Please refer to After Visit Summary for other counseling recommendations.   Return in about 2 weeks (around 02/17/2020) for ROB, virtual.   Mariel Aloe, MD

## 2020-02-03 NOTE — Progress Notes (Signed)
Pt is here for ROB, 2 hr GTT, [redacted]w[redacted]d.

## 2020-02-03 NOTE — Patient Instructions (Signed)

## 2020-02-04 LAB — GLUCOSE TOLERANCE, 2 HOURS W/ 1HR
Glucose, 1 hour: 95 mg/dL (ref 65–179)
Glucose, 2 hour: 57 mg/dL — ABNORMAL LOW (ref 65–152)
Glucose, Fasting: 85 mg/dL (ref 65–91)

## 2020-02-04 LAB — CBC
Hematocrit: 31.1 % — ABNORMAL LOW (ref 34.0–46.6)
Hemoglobin: 9.6 g/dL — ABNORMAL LOW (ref 11.1–15.9)
MCH: 23.9 pg — ABNORMAL LOW (ref 26.6–33.0)
MCHC: 30.9 g/dL — ABNORMAL LOW (ref 31.5–35.7)
MCV: 77 fL — ABNORMAL LOW (ref 79–97)
Platelets: 291 10*3/uL (ref 150–450)
RBC: 4.02 x10E6/uL (ref 3.77–5.28)
RDW: 15.8 % — ABNORMAL HIGH (ref 11.7–15.4)
WBC: 8.2 10*3/uL (ref 3.4–10.8)

## 2020-02-04 LAB — HIV ANTIBODY (ROUTINE TESTING W REFLEX): HIV Screen 4th Generation wRfx: NONREACTIVE

## 2020-02-04 LAB — RPR: RPR Ser Ql: NONREACTIVE

## 2020-02-17 ENCOUNTER — Telehealth (INDEPENDENT_AMBULATORY_CARE_PROVIDER_SITE_OTHER): Payer: Medicaid Other | Admitting: Advanced Practice Midwife

## 2020-02-17 VITALS — BP 126/67 | HR 90

## 2020-02-17 DIAGNOSIS — O99210 Obesity complicating pregnancy, unspecified trimester: Secondary | ICD-10-CM

## 2020-02-17 DIAGNOSIS — D649 Anemia, unspecified: Secondary | ICD-10-CM

## 2020-02-17 DIAGNOSIS — O99013 Anemia complicating pregnancy, third trimester: Secondary | ICD-10-CM

## 2020-02-17 DIAGNOSIS — Z3A29 29 weeks gestation of pregnancy: Secondary | ICD-10-CM

## 2020-02-17 DIAGNOSIS — O09513 Supervision of elderly primigravida, third trimester: Secondary | ICD-10-CM

## 2020-02-17 DIAGNOSIS — E669 Obesity, unspecified: Secondary | ICD-10-CM

## 2020-02-17 DIAGNOSIS — O99213 Obesity complicating pregnancy, third trimester: Secondary | ICD-10-CM

## 2020-02-17 DIAGNOSIS — Z3490 Encounter for supervision of normal pregnancy, unspecified, unspecified trimester: Secondary | ICD-10-CM

## 2020-02-17 DIAGNOSIS — Z148 Genetic carrier of other disease: Secondary | ICD-10-CM

## 2020-02-17 MED ORDER — FERROUS FUMARATE 324 (106 FE) MG PO TABS: 1.0000 | ORAL_TABLET | ORAL | 2 refills | Status: DC

## 2020-02-17 NOTE — Progress Notes (Signed)
I connected with  Traci Ramirez on 02/17/20 by a video enabled telemedicine application and verified that I am speaking with the correct person using two identifiers. Patient is at home and I am at [facility].   I discussed the limitations of evaluation and management by telemedicine. The patient expressed understanding and agreed to proceed.  MyChart OB, reports no problems today.

## 2020-02-17 NOTE — Progress Notes (Signed)
   OBSTETRICS PRENATAL VIRTUAL VISIT ENCOUNTER NOTE  Provider location: [facility]   I connected with Traci Ramirez on 02/17/20 at  8:10 AM EDT by MyChart Video Encounter at home and verified that I am speaking with the correct person using two identifiers.   I discussed the limitations, risks, security and privacy concerns of performing an evaluation and management service virtually and the availability of in person appointments. I also discussed with the patient that there may be a patient responsible charge related to this service. The patient expressed understanding and agreed to proceed. Subjective:  Traci Ramirez is a 34 y.o. G2P0010 at [redacted]w[redacted]d being seen today for ongoing prenatal care.  She is currently monitored for the following issues for this low-risk pregnancy and has Supervision of normal pregnancy, antepartum; Alpha thalassemia silent carrier; Maternal obesity affecting pregnancy, antepartum; AMA (advanced maternal age) primigravida 68+; and [redacted] weeks gestation of pregnancy on their problem list.  Patient reports no complaints.  Contractions: Not present. Vag. Bleeding: None.  Movement: Present. Denies any leaking of fluid.   The following portions of the patient's history were reviewed and updated as appropriate: allergies, current medications, past family history, past medical history, past social history, past surgical history and problem list.   Objective:   Vitals:   02/17/20 0813 02/17/20 0815  BP: (!) 142/77 126/67  Pulse: 92 90    Fetal Status:     Movement: Present     General:  Alert, oriented and cooperative. Patient is in no acute distress.  Respiratory: Normal respiratory effort, no problems with respiration noted  Mental Status: Normal mood and affect. Normal behavior. Normal judgment and thought content.  Rest of physical exam deferred due to type of encounter  Imaging: No results found.  Assessment and Plan:  Pregnancy:  G2P0010 at [redacted]w[redacted]d 1. Maternal obesity affecting pregnancy, antepartum   2. Encounter for supervision of normal pregnancy, antepartum, unspecified gravidity --Anticipatory guidance about next visits/weeks of pregnancy given. --Reviewed 28 week labs, including 2 hour GTT. CBC with low Hgb, see anema below. --Next visit in 4 weeks, virtual  3. Anemia during pregnancy in third trimester --Hgb 9.6 on 02/03/20.  No s/sx of anemia. --Start iron supplement every other day.   --Increase intake of iron rich foods, reviewed list - Ferrous Fumarate (HEMOCYTE - 106 MG FE) 324 (106 Fe) MG TABS tablet; Take 1 tablet (106 mg of iron total) by mouth every other day.  Dispense: 30 tablet; Refill: 2  Preterm labor symptoms and general obstetric precautions including but not limited to vaginal bleeding, contractions, leaking of fluid and fetal movement were reviewed in detail with the patient. I discussed the assessment and treatment plan with the patient. The patient was provided an opportunity to ask questions and all were answered. The patient agreed with the plan and demonstrated an understanding of the instructions. The patient was advised to call back or seek an in-person office evaluation/go to MAU at [facility] for any urgent or concerning symptoms. Please refer to After Visit Summary for other counseling recommendations.   I provided 10 minutes of face-to-face time during this encounter.  Return in about 4 weeks (around 03/16/2020).  No future appointments.  Sharen Counter, CNM [facility] [employer], [facility]

## 2020-03-07 ENCOUNTER — Ambulatory Visit (INDEPENDENT_AMBULATORY_CARE_PROVIDER_SITE_OTHER): Payer: Self-pay | Admitting: Pediatrics

## 2020-03-07 ENCOUNTER — Other Ambulatory Visit: Payer: Self-pay

## 2020-03-07 DIAGNOSIS — Z7681 Expectant parent(s) prebirth pediatrician visit: Secondary | ICD-10-CM

## 2020-03-07 NOTE — Progress Notes (Signed)
Prenatal counseling for impending newborn done--  Reviewed current vaccine policy and answered all questions.  1st child, Currently 32 weeks, Current complications:  none, Prenatal care initiated:  Around 8 weeks Z76.81

## 2020-03-16 ENCOUNTER — Telehealth (INDEPENDENT_AMBULATORY_CARE_PROVIDER_SITE_OTHER): Payer: Medicaid Other | Admitting: Certified Nurse Midwife

## 2020-03-16 VITALS — BP 111/82 | HR 92

## 2020-03-16 DIAGNOSIS — Z3A33 33 weeks gestation of pregnancy: Secondary | ICD-10-CM

## 2020-03-16 DIAGNOSIS — O09523 Supervision of elderly multigravida, third trimester: Secondary | ICD-10-CM

## 2020-03-16 DIAGNOSIS — O99213 Obesity complicating pregnancy, third trimester: Secondary | ICD-10-CM

## 2020-03-16 DIAGNOSIS — Z3403 Encounter for supervision of normal first pregnancy, third trimester: Secondary | ICD-10-CM

## 2020-03-16 DIAGNOSIS — Z148 Genetic carrier of other disease: Secondary | ICD-10-CM

## 2020-03-16 DIAGNOSIS — E669 Obesity, unspecified: Secondary | ICD-10-CM

## 2020-03-16 NOTE — Progress Notes (Signed)
   OBSTETRICS PRENATAL VIRTUAL VISIT ENCOUNTER NOTE  Provider location: [facility]   I connected with Traci Ramirez on 03/16/20 at  9:10 AM EDT by MyChart Video Encounter at home and verified that I am speaking with the correct person using two identifiers.   I discussed the limitations, risks, security and privacy concerns of performing an evaluation and management service virtually and the availability of in person appointments. I also discussed with the patient that there may be a patient responsible charge related to this service. The patient expressed understanding and agreed to proceed. Subjective:  Traci Ramirez is a 34 y.o. G2P0010 at [redacted]w[redacted]d being seen today for ongoing prenatal care.  She is currently monitored for the following issues for this low-risk pregnancy and has Supervision of normal pregnancy, antepartum; Alpha thalassemia silent carrier; Maternal obesity affecting pregnancy, antepartum; AMA (advanced maternal age) primigravida 92+; [redacted] weeks gestation of pregnancy; and Anemia during pregnancy in third trimester on their problem list.  Patient reports no complaints.  Contractions: Not present. Vag. Bleeding: None.  Movement: Present. Denies any leaking of fluid.   The following portions of the patient's history were reviewed and updated as appropriate: allergies, current medications, past family history, past medical history, past social history, past surgical history and problem list.   Objective:   Vitals:   03/16/20 0905  BP: 111/82  Pulse: 92    Fetal Status:     Movement: Present     General:  Alert, oriented and cooperative. Patient is in no acute distress.  Respiratory: Normal respiratory effort, no problems with respiration noted  Mental Status: Normal mood and affect. Normal behavior. Normal judgment and thought content.  Rest of physical exam deferred due to type of encounter  Imaging: No results found.  Assessment and Plan:    Pregnancy: G2P0010 at [redacted]w[redacted]d 1. Encounter for supervision of normal first pregnancy in third trimester - Pt doing well without complaints, had questions about labor and delivery procedures.  - Discussed low CS rate and our preference for vaginal deliveries. Answered questions about ECV and induction procedures. Pt verbalized understanding. 2. [redacted] weeks gestation of pregnancy - Provided anticipatory guidance regarding GBS testing at next visit, and management if testing shows + GBS.  Preterm labor symptoms and general obstetric precautions including but not limited to vaginal bleeding, contractions, leaking of fluid and fetal movement were reviewed in detail with the patient. I discussed the assessment and treatment plan with the patient. The patient was provided an opportunity to ask questions and all were answered. The patient agreed with the plan and demonstrated an understanding of the instructions. The patient was advised to call back or seek an in-person office evaluation/go to MAU at [facility] for any urgent or concerning symptoms. Please refer to After Visit Summary for other counseling recommendations.   I provided 15 minutes of face-to-face time during this encounter.  Return in about 3 weeks (around 04/06/2020) for LOB w GBS, IN-PERSON.  Edd Arbour, CNM, MSN, [facility] 03/16/20 9:28 AM

## 2020-03-16 NOTE — Progress Notes (Signed)
Pt is on the phone preparing for virtual visit with provider, [redacted]w[redacted]d.

## 2020-04-05 ENCOUNTER — Other Ambulatory Visit (HOSPITAL_COMMUNITY)
Admission: RE | Admit: 2020-04-05 | Discharge: 2020-04-05 | Disposition: A | Discharge status: Home / Self Care | Payer: Medicaid Other | Source: Ambulatory Visit | Attending: Obstetrics and Gynecology | Admitting: Obstetrics and Gynecology

## 2020-04-05 ENCOUNTER — Ambulatory Visit (INDEPENDENT_AMBULATORY_CARE_PROVIDER_SITE_OTHER): Payer: Medicaid Other | Admitting: Obstetrics and Gynecology

## 2020-04-05 ENCOUNTER — Other Ambulatory Visit: Payer: Self-pay

## 2020-04-05 VITALS — BP 120/81 | HR 94 | Wt 254.4 lb

## 2020-04-05 DIAGNOSIS — Z3490 Encounter for supervision of normal pregnancy, unspecified, unspecified trimester: Secondary | ICD-10-CM | POA: Diagnosis not present

## 2020-04-05 DIAGNOSIS — B951 Streptococcus, group B, as the cause of diseases classified elsewhere: Secondary | ICD-10-CM

## 2020-04-05 NOTE — Patient Instructions (Signed)

## 2020-04-05 NOTE — Progress Notes (Signed)
   PRENATAL VISIT NOTE  Subjective:  Traci Ramirez is a 34 y.o. G2P0010 at [redacted]w[redacted]d being seen today for ongoing prenatal care.  She is currently monitored for the following issues for this low-risk pregnancy and has Supervision of normal pregnancy, antepartum; Alpha thalassemia silent carrier; Maternal obesity affecting pregnancy, antepartum; AMA (advanced maternal age) primigravida 34+; [redacted] weeks gestation of pregnancy; and Anemia during pregnancy in third trimester on their problem list.  Patient reports no complaints.  Contractions: Irritability. Vag. Bleeding: None.  Movement: Present. Denies leaking of fluid.   The following portions of the patient's history were reviewed and updated as appropriate: allergies, current medications, past family history, past medical history, past social history, past surgical history and problem list.   Objective:   Vitals:   04/05/20 1001  BP: 120/81  Pulse: 94  Weight: 254 lb 6.4 oz (115.4 kg)    Fetal Status: Fetal Heart Rate (bpm): 145 Fundal Height: 37 cm Movement: Present  Presentation: Undeterminable  General:  Alert, oriented and cooperative. Patient is in no acute distress.  Skin: Skin is warm and dry. No rash noted.   Cardiovascular: Normal heart rate noted  Respiratory: Normal respiratory effort, no problems with respiration noted  Abdomen: Soft, gravid, appropriate for gestational age.  Pain/Pressure: Present     Pelvic: Cervical exam performed in the presence of a chaperone Dilation: Closed Effacement (%): Thick Station: Ballotable  Extremities: Normal range of motion.  Edema: None  Mental Status: Normal mood and affect. Normal behavior. Normal judgment and thought content.   Assessment and Plan:  Pregnancy: G2P0010 at [redacted]w[redacted]d  1. Encounter for supervision of normal pregnancy, antepartum, unspecified gravidity  - Strep Gp B NAA - Cervicovaginal ancillary only( [place]) - Discussed cervical ripening.   Preterm labor symptoms and  general obstetric precautions including but not limited to vaginal bleeding, contractions, leaking of fluid and fetal movement were reviewed in detail with the patient. Please refer to After Visit Summary for other counseling recommendations.   Return in about 1 week (around 04/12/2020), or Low risk; APP schedule OK.  No future appointments.  Venia Carbon, NP

## 2020-04-05 NOTE — Progress Notes (Signed)
ROB   GBS today   CC: None

## 2020-04-06 LAB — CERVICOVAGINAL ANCILLARY ONLY
Chlamydia: NEGATIVE
Comment: NEGATIVE
Comment: NEGATIVE
Comment: NORMAL
Neisseria Gonorrhea: NEGATIVE
Trichomonas: NEGATIVE

## 2020-04-07 LAB — STREP GP B NAA: Strep Gp B NAA: POSITIVE — AB

## 2020-04-08 DIAGNOSIS — B951 Streptococcus, group B, as the cause of diseases classified elsewhere: Secondary | ICD-10-CM | POA: Insufficient documentation

## 2020-04-12 ENCOUNTER — Other Ambulatory Visit: Payer: Self-pay

## 2020-04-12 ENCOUNTER — Ambulatory Visit (INDEPENDENT_AMBULATORY_CARE_PROVIDER_SITE_OTHER): Payer: Medicaid Other

## 2020-04-12 ENCOUNTER — Ambulatory Visit (INDEPENDENT_AMBULATORY_CARE_PROVIDER_SITE_OTHER): Payer: Medicaid Other | Admitting: Obstetrics and Gynecology

## 2020-04-12 ENCOUNTER — Encounter: Payer: Self-pay | Admitting: Obstetrics and Gynecology

## 2020-04-12 VITALS — Wt 255.9 lb

## 2020-04-12 DIAGNOSIS — Z3490 Encounter for supervision of normal pregnancy, unspecified, unspecified trimester: Secondary | ICD-10-CM | POA: Diagnosis not present

## 2020-04-12 DIAGNOSIS — Z3689 Encounter for other specified antenatal screening: Secondary | ICD-10-CM

## 2020-04-12 DIAGNOSIS — D563 Thalassemia minor: Secondary | ICD-10-CM

## 2020-04-12 DIAGNOSIS — Z23 Encounter for immunization: Secondary | ICD-10-CM

## 2020-04-12 DIAGNOSIS — B951 Streptococcus, group B, as the cause of diseases classified elsewhere: Secondary | ICD-10-CM

## 2020-04-12 NOTE — Progress Notes (Signed)
Subjective:  Traci Ramirez is a 34 y.o. G2P0010 at [redacted]w[redacted]d being seen today for ongoing prenatal care.  She is currently monitored for the following issues for this low-risk pregnancy and has Supervision of normal pregnancy, antepartum; Alpha thalassemia silent carrier; Maternal obesity affecting pregnancy, antepartum; AMA (advanced maternal age) primigravida 35+; [redacted] weeks gestation of pregnancy; Anemia during pregnancy in third trimester; and Positive GBS test on their problem list.  Patient reports general discomforts of pregnancy.  Contractions: Irritability. Vag. Bleeding: None.  Movement: Present. Denies leaking of fluid.   The following portions of the patient's history were reviewed and updated as appropriate: allergies, current medications, past family history, past medical history, past social history, past surgical history and problem list. Problem list updated.  Objective:   Vitals:   04/12/20 1314  Weight: 255 lb 14.4 oz (116.1 kg)    Fetal Status: Fetal Heart Rate (bpm): 130   Movement: Present     General:  Alert, oriented and cooperative. Patient is in no acute distress.  Skin: Skin is warm and dry. No rash noted.   Cardiovascular: Normal heart rate noted  Respiratory: Normal respiratory effort, no problems with respiration noted  Abdomen: Soft, gravid, appropriate for gestational age. Pain/Pressure: Present     Pelvic:  Cervical exam performed        Extremities: Normal range of motion.  Edema: None  Mental Status: Normal mood and affect. Normal behavior. Normal judgment and thought content.   Urinalysis:      Assessment and Plan:  Pregnancy: G2P0010 at [redacted]w[redacted]d  1. Encounter for supervision of normal pregnancy, antepartum, unspecified gravidity Stable Labor precautions - Flu Vaccine QUAD 36+ mos IM (Fluarix, Quad PF)  2. Encounter for immunization  - Flu Vaccine QUAD 36+ mos IM (Fluarix, Quad PF)  3. Positive GBS test Tx while in labor  4. Alpha thalassemia  silent carrier Stable  5. Determine fetal presentation using ultrasound Vertex presentation confirmed today - US OB Limited; Future  Term labor symptoms and general obstetric precautions including but not limited to vaginal bleeding, contractions, leaking of fluid and fetal movement were reviewed in detail with the patient. Please refer to After Visit Summary for other counseling recommendations.  Return in about 1 week (around 04/19/2020) for OB visit, face to face, any provider.   Hermina Staggers, MD

## 2020-04-12 NOTE — Progress Notes (Signed)
Patient presents for ROB. Patient would like to have cervix checked again. She has no other concerns.

## 2020-04-18 ENCOUNTER — Other Ambulatory Visit: Payer: Self-pay

## 2020-04-18 ENCOUNTER — Ambulatory Visit (INDEPENDENT_AMBULATORY_CARE_PROVIDER_SITE_OTHER): Payer: Medicaid Other | Admitting: Obstetrics and Gynecology

## 2020-04-18 ENCOUNTER — Encounter: Payer: Self-pay | Admitting: Obstetrics and Gynecology

## 2020-04-18 VITALS — BP 129/80 | HR 105 | Wt 259.0 lb

## 2020-04-18 DIAGNOSIS — O09513 Supervision of elderly primigravida, third trimester: Secondary | ICD-10-CM

## 2020-04-18 DIAGNOSIS — O99013 Anemia complicating pregnancy, third trimester: Secondary | ICD-10-CM

## 2020-04-18 DIAGNOSIS — Z3490 Encounter for supervision of normal pregnancy, unspecified, unspecified trimester: Secondary | ICD-10-CM

## 2020-04-18 DIAGNOSIS — D563 Thalassemia minor: Secondary | ICD-10-CM

## 2020-04-18 DIAGNOSIS — B951 Streptococcus, group B, as the cause of diseases classified elsewhere: Secondary | ICD-10-CM

## 2020-04-18 NOTE — Progress Notes (Signed)
Pt is doing well.

## 2020-04-18 NOTE — Progress Notes (Signed)
Subjective:  Traci Ramirez is a 34 y.o. G2P0010 at [redacted]w[redacted]d being seen today for ongoing prenatal care.  She is currently monitored for the following issues for this high risk pregnancy and has Supervision of normal pregnancy, antepartum; Alpha thalassemia silent carrier; Maternal obesity affecting pregnancy, antepartum; AMA (advanced maternal age) primigravida 37+; Anemia during pregnancy in third trimester; and Positive GBS test on their problem list.  Patient reports general discomforts of pregnancy.  Contractions: Irregular. Vag. Bleeding: None.  Movement: Present. Denies leaking of fluid.   The following portions of the patient's history were reviewed and updated as appropriate: allergies, current medications, past family history, past medical history, past social history, past surgical history and problem list. Problem list updated.  Objective:   Vitals:   04/18/20 1414  BP: 129/80  Pulse: (!) 105  Weight: 259 lb (117.5 kg)    Fetal Status: Fetal Heart Rate (bpm): 135   Movement: Present     General:  Alert, oriented and cooperative. Patient is in no acute distress.  Skin: Skin is warm and dry. No rash noted.   Cardiovascular: Normal heart rate noted  Respiratory: Normal respiratory effort, no problems with respiration noted  Abdomen: Soft, gravid, appropriate for gestational age. Pain/Pressure: Present     Pelvic:  Cervical exam deferred        Extremities: Normal range of motion.  Edema: None  Mental Status: Normal mood and affect. Normal behavior. Normal judgment and thought content.   Urinalysis:      Assessment and Plan:  Pregnancy: G2P0010 at [redacted]w[redacted]d  1. Encounter for supervision of normal pregnancy, antepartum, unspecified gravidity Stable Labor precautions  2. Positive GBS test Tx while in labor  3. Primigravida of advanced maternal age in third trimester Stable  4. Anemia during pregnancy in third trimester Continue with iron supplement  5. Alpha thalassemia  silent carrier Stable  Term labor symptoms and general obstetric precautions including but not limited to vaginal bleeding, contractions, leaking of fluid and fetal movement were reviewed in detail with the patient. Please refer to After Visit Summary for other counseling recommendations.  Return in about 1 week (around 04/25/2020) for OB visit, face to face, MD only.   Hermina Staggers, MD

## 2020-04-18 NOTE — Patient Instructions (Signed)

## 2020-04-23 ENCOUNTER — Inpatient Hospital Stay (HOSPITAL_COMMUNITY)
Admission: AD | Admit: 2020-04-23 | Discharge: 2020-04-23 | Disposition: A | Discharge status: Home / Self Care | Payer: Medicaid Other | Attending: Obstetrics and Gynecology | Admitting: Obstetrics and Gynecology

## 2020-04-23 ENCOUNTER — Encounter (HOSPITAL_COMMUNITY): Payer: Self-pay | Admitting: Obstetrics and Gynecology

## 2020-04-23 ENCOUNTER — Other Ambulatory Visit: Payer: Self-pay

## 2020-04-23 DIAGNOSIS — Z3490 Encounter for supervision of normal pregnancy, unspecified, unspecified trimester: Secondary | ICD-10-CM

## 2020-04-23 DIAGNOSIS — O26893 Other specified pregnancy related conditions, third trimester: Secondary | ICD-10-CM | POA: Insufficient documentation

## 2020-04-23 DIAGNOSIS — O471 False labor at or after 37 completed weeks of gestation: Secondary | ICD-10-CM

## 2020-04-23 DIAGNOSIS — O09513 Supervision of elderly primigravida, third trimester: Secondary | ICD-10-CM

## 2020-04-23 DIAGNOSIS — Z3A39 39 weeks gestation of pregnancy: Secondary | ICD-10-CM | POA: Diagnosis not present

## 2020-04-23 DIAGNOSIS — R1030 Lower abdominal pain, unspecified: Secondary | ICD-10-CM | POA: Insufficient documentation

## 2020-04-23 DIAGNOSIS — O479 False labor, unspecified: Secondary | ICD-10-CM

## 2020-04-23 DIAGNOSIS — O99210 Obesity complicating pregnancy, unspecified trimester: Secondary | ICD-10-CM

## 2020-04-23 DIAGNOSIS — B951 Streptococcus, group B, as the cause of diseases classified elsewhere: Secondary | ICD-10-CM

## 2020-04-23 LAB — CBC
HCT: 32.2 % — ABNORMAL LOW (ref 36.0–46.0)
Hemoglobin: 9.8 g/dL — ABNORMAL LOW (ref 12.0–15.0)
MCH: 22.3 pg — ABNORMAL LOW (ref 26.0–34.0)
MCHC: 30.4 g/dL (ref 30.0–36.0)
MCV: 73.2 fL — ABNORMAL LOW (ref 80.0–100.0)
Platelets: 305 10*3/uL (ref 150–400)
RBC: 4.4 MIL/uL (ref 3.87–5.11)
RDW: 17.6 % — ABNORMAL HIGH (ref 11.5–15.5)
WBC: 8.4 10*3/uL (ref 4.0–10.5)
nRBC: 0 % (ref 0.0–0.2)

## 2020-04-23 LAB — COMPREHENSIVE METABOLIC PANEL
ALT: 12 U/L (ref 0–44)
AST: 17 U/L (ref 15–41)
Albumin: 3 g/dL — ABNORMAL LOW (ref 3.5–5.0)
Alkaline Phosphatase: 151 U/L — ABNORMAL HIGH (ref 38–126)
Anion gap: 8 (ref 5–15)
BUN: 9 mg/dL (ref 6–20)
CO2: 20 mmol/L — ABNORMAL LOW (ref 22–32)
Calcium: 8.7 mg/dL — ABNORMAL LOW (ref 8.9–10.3)
Chloride: 107 mmol/L (ref 98–111)
Creatinine, Ser: 0.8 mg/dL (ref 0.44–1.00)
GFR, Estimated: >60 mL/min (ref >60)
Glucose, Bld: 96 mg/dL (ref 70–99)
Potassium: 3.4 mmol/L — ABNORMAL LOW (ref 3.5–5.1)
Sodium: 135 mmol/L (ref 135–145)
Total Bilirubin: 0.9 mg/dL (ref 0.3–1.2)
Total Protein: 6.3 g/dL — ABNORMAL LOW (ref 6.5–8.1)

## 2020-04-23 LAB — PROTEIN / CREATININE RATIO, URINE
Creatinine, Urine: 563.94 mg/dL
Protein Creatinine Ratio: 0.02 mg/mg{Cre} (ref 0.00–0.15)
Total Protein, Urine: 9 mg/dL

## 2020-04-23 NOTE — MAU Note (Addendum)
PT transferred from [facility] ED.  She has been there since 1030 with her partner who is a being evaluated for seizures.  States cntrx  started about 25 minutes ago. Denies LOF or VB. +FM.  Was closed at last OB appointment. Has not had food or drink today since being in [facility] ED waiting area.

## 2020-04-23 NOTE — MAU Provider Note (Signed)
None     Chief Complaint:  Contractions   Traci Ramirez is  34 y.o. G2P0010 at 44w0dpresents complaining of Contractions .  She states she has been in the ED most of the day w/her partner who had several seizures today. While there, she started feeling ctx for about 20 minutes and decided to get checked.  No cx change during MAU stay.  FHR Cat 1, UI w/occ ctx on EFM.  Denies VB or LOF along with active fetal movement. Had an isolated SBP of 147, all other BPs normal (109-132/63-80), so PreE baseline labs were collected, all normal.    Obstetrical/Gynecological History: OB History    Gravida  2   Para      Term      Preterm      AB  1   Living        SAB  1   TAB      Ectopic      Multiple      Live Births             Past Medical History: Past Medical History:  Diagnosis Date  . Medical history non-contributory     Past Surgical History: Past Surgical History:  Procedure Laterality Date  . NO PAST SURGERIES      Family History: Family History  Problem Relation Age of Onset  . Lung cancer Mother   . Brain cancer Mother     Social History: Social History   Tobacco Use  . Smoking status: Never Smoker  . Smokeless tobacco: Never Used  Vaping Use  . Vaping Use: Never used  Substance Use Topics  . Alcohol use: Never  . Drug use: Never    Allergies: No Known Allergies  Meds:  Medications Prior to Admission  Medication Sig Dispense Refill Last Dose  . aspirin EC 81 MG tablet Take 1 tablet (81 mg total) by mouth daily. Take after 12 weeks for prevention of preeclampsia later in pregnancy 300 tablet 2 04/23/2020 at Unknown time  . Ferrous Fumarate (HEMOCYTE - 106 MG FE) 324 (106 Fe) MG TABS tablet Take 1 tablet (106 mg of iron total) by mouth every other day. 30 tablet 2 04/22/2020 at Unknown time  . Prenatal Vit-Fe Fumarate-FA (PRENATAL VITAMIN PO) Take by mouth.   04/23/2020 at Unknown time  . Blood Pressure Monitoring (BLOOD PRESSURE KIT) DEVI  1 kit by Does not apply route once a week. Check Blood Pressure regularly and record readings into the [employer] App.  Large Cuff.  DX O90.0 1 each 0     Review of Systems   Constitutional: Negative for fever and chills Eyes: Negative for visual disturbances Respiratory: Negative for shortness of breath, dyspnea Cardiovascular: Negative for chest pain or palpitations  Gastrointestinal: Negative for vomiting, diarrhea and constipation Genitourinary: Negative for dysuria and urgency Musculoskeletal: Negative for back pain, joint pain, myalgias.  Normal ROM  Neurological: Negative for dizziness and headaches    Physical Exam  Blood pressure (!) 111/59, pulse 89, temperature 99.2 F (37.3 C), temperature source Oral, resp. rate 18, height 5' 6"  (1.676 m), weight 117.6 kg, last menstrual period 07/25/2019, SpO2 100 %. GENERAL: Well-developed, well-nourished female in no acute distress.  LUNGS: Normal respiratory effort HEART: Regular rate and rhythm. ABDOMEN: Soft, nontender, nondistended, gravid.  EXTREMITIES: Nontender, no edema, 2+ distal pulses. DTR's 2+ CERVICAL EXAM: Dilatation 0cm   Effacement 0%   Station -3   Presentation: cephalic FHT:  Baseline rate 135 bpm  Variability moderate  Accelerations present   Decelerations none Contractions:  UI w/occ ctx Labs: Results for orders placed or performed during the hospital encounter of 04/23/20 (from the past 24 hour(s))  Protein / creatinine ratio, urine   Collection Time: 04/23/20  8:03 PM  Result Value Ref Range   Creatinine, Urine 563.94 mg/dL   Total Protein, Urine 9 mg/dL   Protein Creatinine Ratio 0.02 0.00 - 0.15 mg/mg[Cre]  CBC   Collection Time: 04/23/20  8:23 PM  Result Value Ref Range   WBC 8.4 4.0 - 10.5 K/uL   RBC 4.40 3.87 - 5.11 MIL/uL   Hemoglobin 9.8 (L) 12.0 - 15.0 g/dL   HCT 32.2 (L) 36 - 46 %   MCV 73.2 (L) 80.0 - 100.0 fL   MCH 22.3 (L) 26.0 - 34.0 pg   MCHC 30.4 30.0 - 36.0 g/dL   RDW 17.6 (H) 11.5  - 15.5 %   Platelets 305 150 - 400 K/uL   nRBC 0.0 0.0 - 0.2 %  Comprehensive metabolic panel   Collection Time: 04/23/20  8:23 PM  Result Value Ref Range   Sodium 135 135 - 145 mmol/L   Potassium 3.4 (L) 3.5 - 5.1 mmol/L   Chloride 107 98 - 111 mmol/L   CO2 20 (L) 22 - 32 mmol/L   Glucose, Bld 96 70 - 99 mg/dL   BUN 9 6 - 20 mg/dL   Creatinine, Ser 0.80 0.44 - 1.00 mg/dL   Calcium 8.7 (L) 8.9 - 10.3 mg/dL   Total Protein 6.3 (L) 6.5 - 8.1 g/dL   Albumin 3.0 (L) 3.5 - 5.0 g/dL   AST 17 15 - 41 U/L   ALT 12 0 - 44 U/L   Alkaline Phosphatase 151 (H) 38 - 126 U/L   Total Bilirubin 0.9 0.3 - 1.2 mg/dL   GFR, Estimated >60 >60 mL/min   Anion gap 8 5 - 15   Imaging Studies:    Assessment: Traci Ramirez is  34 y.o. G2P0010 at 1w0dpresents with braxton hicks contractions, isolated increased SBP.  Plan: DC home.  F/U 2 days (as scheduled) for PNV  FChristin Fudge11/29/202110:19 PM

## 2020-04-23 NOTE — ED Notes (Signed)
[redacted] weeks pregnant pt c/o contractions that started 20 min ago. PA at her side assessing the pt.

## 2020-04-23 NOTE — ED Triage Notes (Signed)
Emergency Medicine Provider OB Triage Evaluation Note  Traci Ramirez is a 34 y.o. female, G2P0010, at [redacted]w[redacted]d gestation who presents to the emergency department with complaints of contractions.  She is followed by [facility].  States she called them and they told her to come here to emergency department.  Contractions started 20 minutes ago.  Describes intermittent tightness to her lower abdomen.  She denies sensation of needing to push, fluid leakage or vaginal bleeding.  Review of  Systems  Positive: Abd pain Negative: Nausea, vomiting, vaginal bleeding, fluid leakage, sensation of needing to push  Physical Exam  BP 125/83 (BP Location: Right Arm)    Pulse 92    Temp 98.1 F (36.7 C) (Oral)    Resp 18    LMP 07/25/2019 (Exact Date)    SpO2 100%  General: Awake, no distress  HEENT: Atraumatic  Resp: Normal effort  Cardiac: Normal rate Abd: Nondistended, non-tender, Gravid abdomen MSK: Moves all extremities without difficulty Neuro: Speech clear  Medical Decision Making  Pt evaluated for pregnancy concern and is stable for transfer to MAU. Pt is in agreement with plan for transfer.  Patient partially [redacted] weeks pregnant presents for evaluation of low normal cramping which began 20 minutes PTA.  6:42 PM Discussed with MAU APP, Dr. Vergie Living, who accepts patient in transfer.  Clinical Impression   1. [redacted] weeks gestation of pregnancy   2. Lower abdominal pain        Hall Birchard A, PA-C 04/23/20 1842

## 2020-04-23 NOTE — Discharge Instructions (Signed)
Braxton Hicks Contractions °Contractions of the uterus can occur throughout pregnancy, but they are not always a sign that you are in labor. You may have practice contractions called Braxton Hicks contractions. These false labor contractions are sometimes confused with true labor. °What are Braxton Hicks contractions? °Braxton Hicks contractions are tightening movements that occur in the muscles of the uterus before labor. Unlike true labor contractions, these contractions do not result in opening (dilation) and thinning of the cervix. Toward the end of pregnancy (32-34 weeks), Braxton Hicks contractions can happen more often and may become stronger. These contractions are sometimes difficult to tell apart from true labor because they can be very uncomfortable. You should not feel embarrassed if you go to the hospital with false labor. °Sometimes, the only way to tell if you are in true labor is for your health care provider to look for changes in the cervix. The health care provider will do a physical exam and may monitor your contractions. If you are not in true labor, the exam should show that your cervix is not dilating and your water has not broken. °If there are no other health problems associated with your pregnancy, it is completely safe for you to be sent home with false labor. You may continue to have Braxton Hicks contractions until you go into true labor. °How to tell the difference between true labor and false labor °True labor °· Contractions last 30-70 seconds. °· Contractions become very regular. °· Discomfort is usually felt in the top of the uterus, and it spreads to the lower abdomen and low back. °· Contractions do not go away with walking. °· Contractions usually become more intense and increase in frequency. °· The cervix dilates and gets thinner. °False labor °· Contractions are usually shorter and not as strong as true labor contractions. °· Contractions are usually irregular. °· Contractions  are often felt in the front of the lower abdomen and in the groin. °· Contractions may go away when you walk around or change positions while lying down. °· Contractions get weaker and are shorter-lasting as time goes on. °· The cervix usually does not dilate or become thin. °Follow these instructions at home: ° °· Take over-the-counter and prescription medicines only as told by your health care provider. °· Keep up with your usual exercises and follow other instructions from your health care provider. °· Eat and drink lightly if you think you are going into labor. °· If Braxton Hicks contractions are making you uncomfortable: °? Change your position from lying down or resting to walking, or change from walking to resting. °? Sit and rest in a tub of warm water. °? Drink enough fluid to keep your urine pale yellow. Dehydration may cause these contractions. °? Do slow and deep breathing several times an hour. °· Keep all follow-up prenatal visits as told by your health care provider. This is important. °Contact a health care provider if: °· You have a fever. °· You have continuous pain in your abdomen. °Get help right away if: °· Your contractions become stronger, more regular, and closer together. °· You have fluid leaking or gushing from your vagina. °· You pass blood-tinged mucus (bloody show). °· You have bleeding from your vagina. °· You have low back pain that you never had before. °· You feel your baby’s head pushing down and causing pelvic pressure. °· Your baby is not moving inside you as much as it used to. °Summary °· Contractions that occur before labor are   called Braxton Hicks contractions, false labor, or practice contractions. °· Braxton Hicks contractions are usually shorter, weaker, farther apart, and less regular than true labor contractions. True labor contractions usually become progressively stronger and regular, and they become more frequent. °· Manage discomfort from Braxton Hicks contractions  by changing position, resting in a warm bath, drinking plenty of water, or practicing deep breathing. °This information is not intended to replace advice given to you by your health care provider. Make sure you discuss any questions you have with your health care provider. °Document Revised: 04/24/2017 Document Reviewed: 09/25/2016 °Elsevier Patient Education © 2020 Elsevier Inc. ° °

## 2020-04-25 ENCOUNTER — Encounter: Payer: Medicaid Other | Admitting: Obstetrics and Gynecology

## 2020-04-29 ENCOUNTER — Encounter (HOSPITAL_COMMUNITY): Payer: Self-pay | Admitting: Obstetrics and Gynecology

## 2020-04-29 ENCOUNTER — Inpatient Hospital Stay (HOSPITAL_COMMUNITY)
Admission: AD | Admit: 2020-04-29 | Discharge: 2020-04-29 | Disposition: A | Discharge status: Home / Self Care | Payer: Medicaid Other | Attending: Obstetrics and Gynecology | Admitting: Obstetrics and Gynecology

## 2020-04-29 DIAGNOSIS — O471 False labor at or after 37 completed weeks of gestation: Secondary | ICD-10-CM | POA: Diagnosis present

## 2020-04-29 DIAGNOSIS — O479 False labor, unspecified: Secondary | ICD-10-CM

## 2020-04-29 DIAGNOSIS — Z3A40 40 weeks gestation of pregnancy: Secondary | ICD-10-CM | POA: Insufficient documentation

## 2020-04-29 DIAGNOSIS — O480 Post-term pregnancy: Secondary | ICD-10-CM | POA: Diagnosis not present

## 2020-04-29 NOTE — MAU Note (Signed)
Pt reports to mau via ems with c/o ctx q 5 min since 0230 this am. Denies lof.  Reports good fm

## 2020-04-29 NOTE — Discharge Instructions (Signed)
Fetal Movement Counts Patient Name: ________________________________________________ Patient Due Date: ____________________ What is a fetal movement count?  A fetal movement count is the number of times that you feel your baby move during a certain amount of time. This may also be called a fetal kick count. A fetal movement count is recommended for every pregnant woman. You may be asked to start counting fetal movements as early as week 28 of your pregnancy. Pay attention to when your baby is most active. You may notice your baby's sleep and wake cycles. You may also notice things that make your baby move more. You should do a fetal movement count:  When your baby is normally most active.  At the same time each day. A good time to count movements is while you are resting, after having something to eat and drink. How do I count fetal movements? 1. Find a quiet, comfortable area. Sit, or lie down on your side. 2. Write down the date, the start time and stop time, and the number of movements that you felt between those two times. Take this information with you to your health care visits. 3. Write down your start time when you feel the first movement. 4. Count kicks, flutters, swishes, rolls, and jabs. You should feel at least 10 movements. 5. You may stop counting after you have felt 10 movements, or if you have been counting for 2 hours. Write down the stop time. 6. If you do not feel 10 movements in 2 hours, contact your health care provider for further instructions. Your health care provider may want to do additional tests to assess your baby's well-being. Contact a health care provider if:  You feel fewer than 10 movements in 2 hours.  Your baby is not moving like he or she usually does. Date: ____________ Start time: ____________ Stop time: ____________ Movements: ____________ Date: ____________ Start time: ____________ Stop time: ____________ Movements: ____________ Date: ____________  Start time: ____________ Stop time: ____________ Movements: ____________ Date: ____________ Start time: ____________ Stop time: ____________ Movements: ____________ Date: ____________ Start time: ____________ Stop time: ____________ Movements: ____________ Date: ____________ Start time: ____________ Stop time: ____________ Movements: ____________ Date: ____________ Start time: ____________ Stop time: ____________ Movements: ____________ Date: ____________ Start time: ____________ Stop time: ____________ Movements: ____________ Date: ____________ Start time: ____________ Stop time: ____________ Movements: ____________ This information is not intended to replace advice given to you by your health care provider. Make sure you discuss any questions you have with your health care provider. Document Revised: 12/30/2018 Document Reviewed: 12/30/2018 Elsevier Patient Education  2020 Elsevier Inc.  

## 2020-04-30 ENCOUNTER — Inpatient Hospital Stay (EMERGENCY_DEPARTMENT_HOSPITAL)
Admission: AD | Admit: 2020-04-30 | Discharge: 2020-04-30 | Disposition: A | Discharge status: Home / Self Care | Payer: Medicaid Other | Source: Home / Self Care | Attending: Family Medicine | Admitting: Family Medicine

## 2020-04-30 ENCOUNTER — Encounter (HOSPITAL_COMMUNITY): Payer: Self-pay | Admitting: Obstetrics and Gynecology

## 2020-04-30 DIAGNOSIS — O4202 Full-term premature rupture of membranes, onset of labor within 24 hours of rupture: Secondary | ICD-10-CM | POA: Diagnosis not present

## 2020-04-30 DIAGNOSIS — Z3A40 40 weeks gestation of pregnancy: Secondary | ICD-10-CM

## 2020-04-30 DIAGNOSIS — O471 False labor at or after 37 completed weeks of gestation: Secondary | ICD-10-CM | POA: Insufficient documentation

## 2020-04-30 DIAGNOSIS — O480 Post-term pregnancy: Secondary | ICD-10-CM | POA: Insufficient documentation

## 2020-04-30 MED ORDER — ACETAMINOPHEN 500 MG PO TABS
1000.0000 mg | ORAL_TABLET | Freq: Once | ORAL | Status: AC
  Administered 2020-04-30: 1000 mg via ORAL
  Filled 2020-04-30: qty 2

## 2020-04-30 NOTE — MAU Note (Addendum)
Traci Ramirez is a 34 y.o. at [redacted]w[redacted]d here in MAU reporting: pt arrived via EMS complaining of worsening contractions since yesterday. Having some mucus bleeding, denies LOF. Endorses + FM.  Onset of complaint: ongoing

## 2020-04-30 NOTE — Discharge Instructions (Signed)
Braxton Hicks Contractions Contractions of the uterus can occur throughout pregnancy, but they are not always a sign that you are in labor. You may have practice contractions called Braxton Hicks contractions. These false labor contractions are sometimes confused with true labor. What are Braxton Hicks contractions? Braxton Hicks contractions are tightening movements that occur in the muscles of the uterus before labor. Unlike true labor contractions, these contractions do not result in opening (dilation) and thinning of the cervix. Toward the end of pregnancy (32-34 weeks), Braxton Hicks contractions can happen more often and may become stronger. These contractions are sometimes difficult to tell apart from true labor because they can be very uncomfortable. You should not feel embarrassed if you go to the hospital with false labor. Sometimes, the only way to tell if you are in true labor is for your health care provider to look for changes in the cervix. The health care provider will do a physical exam and may monitor your contractions. If you are not in true labor, the exam should show that your cervix is not dilating and your water has not broken. If there are no other health problems associated with your pregnancy, it is completely safe for you to be sent home with false labor. You may continue to have Braxton Hicks contractions until you go into true labor. How to tell the difference between true labor and false labor True labor  Contractions last 30-70 seconds.  Contractions become very regular.  Discomfort is usually felt in the top of the uterus, and it spreads to the lower abdomen and low back.  Contractions do not go away with walking.  Contractions usually become more intense and increase in frequency.  The cervix dilates and gets thinner. False labor  Contractions are usually shorter and not as strong as true labor contractions.  Contractions are usually irregular.  Contractions  are often felt in the front of the lower abdomen and in the groin.  Contractions may go away when you walk around or change positions while lying down.  Contractions get weaker and are shorter-lasting as time goes on.  The cervix usually does not dilate or become thin. Follow these instructions at home:   Take over-the-counter and prescription medicines only as told by your health care provider.  Keep up with your usual exercises and follow other instructions from your health care provider.  Eat and drink lightly if you think you are going into labor.  If Braxton Hicks contractions are making you uncomfortable: ? Change your position from lying down or resting to walking, or change from walking to resting. ? Sit and rest in a tub of warm water. ? Drink enough fluid to keep your urine pale yellow. Dehydration may cause these contractions. ? Do slow and deep breathing several times an hour.  Keep all follow-up prenatal visits as told by your health care provider. This is important. Contact a health care provider if:  You have a fever.  You have continuous pain in your abdomen. Get help right away if:  Your contractions become stronger, more regular, and closer together.  You have fluid leaking or gushing from your vagina.  You pass blood-tinged mucus (bloody show).  You have bleeding from your vagina.  You have low back pain that you never had before.  You feel your baby's head pushing down and causing pelvic pressure.  Your baby is not moving inside you as much as it used to. Summary  Contractions that occur before labor are   called Braxton Hicks contractions, false labor, or practice contractions.  Braxton Hicks contractions are usually shorter, weaker, farther apart, and less regular than true labor contractions. True labor contractions usually become progressively stronger and regular, and they become more frequent.  Manage discomfort from Braxton Hicks contractions  by changing position, resting in a warm bath, drinking plenty of water, or practicing deep breathing. This information is not intended to replace advice given to you by your health care provider. Make sure you discuss any questions you have with your health care provider. Document Revised: 04/24/2017 Document Reviewed: 09/25/2016 Elsevier Patient Education  2020 Elsevier Inc[employer] Vaginal Delivery  Vaginal delivery means that you give birth by pushing your baby out of your birth canal (vagina). A team of health care providers will help you before, during, and after vaginal delivery. Birth experiences are unique for every woman and every pregnancy, and birth experiences vary depending on where you choose to give birth. What happens when I arrive at the birth center or hospital? Once you are in labor and have been admitted into the hospital or birth center, your health care provider may:  Review your pregnancy history and any concerns that you have.  Insert an IV into one of your veins. This may be used to give you fluids and medicines.  Check your blood pressure, pulse, temperature, and heart rate (vital signs).  Check whether your bag of water (amniotic sac) has broken (ruptured).  Talk with you about your birth plan and discuss pain control options. Monitoring Your health care provider may monitor your contractions (uterine monitoring) and your baby's heart rate (fetal monitoring). You may need to be monitored:  Often, but not continuously (intermittently).  All the time or for long periods at a time (continuously). Continuous monitoring may be needed if: ? You are taking certain medicines, such as medicine to relieve pain or make your contractions stronger. ? You have pregnancy or labor complications. Monitoring may be done by:  Placing a special stethoscope or a handheld monitoring device on your abdomen to check your baby's heartbeat and to check for contractions.  Placing monitors on  your abdomen (external monitors) to record your baby's heartbeat and the frequency and length of contractions.  Placing monitors inside your uterus through your vagina (internal monitors) to record your baby's heartbeat and the frequency, length, and strength of your contractions. Depending on the type of monitor, it may remain in your uterus or on your baby's head until birth.  Telemetry. This is a type of continuous monitoring that can be done with external or internal monitors. Instead of having to stay in bed, you are able to move around during telemetry. Physical exam Your health care provider may perform frequent physical exams. This may include:  Checking how and where your baby is positioned in your uterus.  Checking your cervix to determine: ? Whether it is thinning out (effacing). ? Whether it is opening up (dilating). What happens during labor and delivery?  Normal labor and delivery is divided into the following three stages: Stage 1  This is the longest stage of labor.  This stage can last for hours or days.  Throughout this stage, you will feel contractions. Contractions generally feel mild, infrequent, and irregular at first. They get stronger, more frequent (about every 2-3 minutes), and more regular as you move through this stage.  This stage ends when your cervix is completely dilated to 4 inches (10 cm) and completely effaced. Stage 2  This stage   starts once your cervix is completely effaced and dilated and lasts until the delivery of your baby.  This stage may last from 20 minutes to 2 hours.  This is the stage where you will feel an urge to push your baby out of your vagina.  You may feel stretching and burning pain, especially when the widest part of your baby's head passes through the vaginal opening (crowning).  Once your baby is delivered, the umbilical cord will be clamped and cut. This usually occurs after waiting a period of 1-2 minutes after  delivery.  Your baby will be placed on your bare chest (skin-to-skin contact) in an upright position and covered with a warm blanket. Watch your baby for feeding cues, like rooting or sucking, and help the baby to your breast for his or her first feeding. Stage 3  This stage starts immediately after the birth of your baby and ends after you deliver the placenta.  This stage may take anywhere from 5 to 30 minutes.  After your baby has been delivered, you will feel contractions as your body expels the placenta and your uterus contracts to control bleeding. What can I expect after labor and delivery?  After labor is over, you and your baby will be monitored closely until you are ready to go home to ensure that you are both healthy. Your health care team will teach you how to care for yourself and your baby.  You and your baby will stay in the same room (rooming in) during your hospital stay. This will encourage early bonding and successful breastfeeding.  You may continue to receive fluids and medicines through an IV.  Your uterus will be checked and massaged regularly (fundal massage).  You will have some soreness and pain in your abdomen, vagina, and the area of skin between your vaginal opening and your anus (perineum).  If an incision was made near your vagina (episiotomy) or if you had some vaginal tearing during delivery, cold compresses may be placed on your episiotomy or your tear. This helps to reduce pain and swelling.  You may be given a squirt bottle to use instead of wiping when you go to the bathroom. To use the squirt bottle, follow these steps: ? Before you urinate, fill the squirt bottle with warm water. Do not use hot water. ? After you urinate, while you are sitting on the toilet, use the squirt bottle to rinse the area around your urethra and vaginal opening. This rinses away any urine and blood. ? Fill the squirt bottle with clean water every time you use the  bathroom.  It is normal to have vaginal bleeding after delivery. Wear a sanitary pad for vaginal bleeding and discharge. Summary  Vaginal delivery means that you will give birth by pushing your baby out of your birth canal (vagina).  Your health care provider may monitor your contractions (uterine monitoring) and your baby's heart rate (fetal monitoring).  Your health care provider may perform a physical exam.  Normal labor and delivery is divided into three stages.  After labor is over, you and your baby will be monitored closely until you are ready to go home. This information is not intended to replace advice given to you by your health care provider. Make sure you discuss any questions you have with your health care provider. Document Revised: 06/16/2017 Document Reviewed: 06/16/2017 Elsevier Patient Education  2020 Elsevier Inc.  

## 2020-04-30 NOTE — MAU Provider Note (Addendum)
S: Ms. Traci Ramirez is a 34 y.o. G2P0010 at [redacted]w[redacted]d  who presents to MAU today for labor evaluation.     Cervical exam by RN:  Dilation: 2.5 Effacement (%): 80 Station: -3 Exam by:: n druebbisch rn  Fetal Monitoring: Baseline: 135 Variability: moderate Accelerations:+accels 15/15 Decelerations: absent Contractions: q8-10 min  MDM Discussed patient with RN. NST reviewed.   A: [facility] at [redacted]w[redacted]d  False labor  P: Discharge home Labor precautions and kick counts included in AVS Patient to follow-up with provider in 2 days as scheduled  Patient may return to MAU as needed or when in labor   [name], MD 04/30/2020 11:45 AM   GME ATTESTATION:  I agree with the findings and the plan of care as documented in the resident's note.  Alric Seton, MD OB Fellow, [facility] 04/30/2020 2:36 PM

## 2020-05-01 ENCOUNTER — Inpatient Hospital Stay (HOSPITAL_COMMUNITY): Payer: Medicaid Other | Admitting: Anesthesiology

## 2020-05-01 ENCOUNTER — Encounter (HOSPITAL_COMMUNITY): Payer: Self-pay | Admitting: Obstetrics and Gynecology

## 2020-05-01 ENCOUNTER — Other Ambulatory Visit: Payer: Self-pay

## 2020-05-01 ENCOUNTER — Inpatient Hospital Stay (HOSPITAL_COMMUNITY)
Admission: AD | Admit: 2020-05-01 | Discharge: 2020-05-03 | DRG: 807 | Disposition: A | Discharge status: Home / Self Care | Payer: Medicaid Other | Attending: Obstetrics & Gynecology | Admitting: Obstetrics & Gynecology

## 2020-05-01 DIAGNOSIS — Z3A40 40 weeks gestation of pregnancy: Secondary | ICD-10-CM

## 2020-05-01 DIAGNOSIS — O480 Post-term pregnancy: Secondary | ICD-10-CM | POA: Diagnosis present

## 2020-05-01 DIAGNOSIS — O9902 Anemia complicating childbirth: Secondary | ICD-10-CM | POA: Diagnosis present

## 2020-05-01 DIAGNOSIS — O99824 Streptococcus B carrier state complicating childbirth: Secondary | ICD-10-CM | POA: Diagnosis present

## 2020-05-01 DIAGNOSIS — D649 Anemia, unspecified: Secondary | ICD-10-CM

## 2020-05-01 DIAGNOSIS — O09513 Supervision of elderly primigravida, third trimester: Secondary | ICD-10-CM

## 2020-05-01 DIAGNOSIS — O26893 Other specified pregnancy related conditions, third trimester: Secondary | ICD-10-CM | POA: Diagnosis present

## 2020-05-01 DIAGNOSIS — O99214 Obesity complicating childbirth: Secondary | ICD-10-CM | POA: Diagnosis present

## 2020-05-01 DIAGNOSIS — D563 Thalassemia minor: Secondary | ICD-10-CM | POA: Diagnosis present

## 2020-05-01 DIAGNOSIS — Z3490 Encounter for supervision of normal pregnancy, unspecified, unspecified trimester: Secondary | ICD-10-CM

## 2020-05-01 DIAGNOSIS — O4202 Full-term premature rupture of membranes, onset of labor within 24 hours of rupture: Secondary | ICD-10-CM

## 2020-05-01 DIAGNOSIS — O1403 Mild to moderate pre-eclampsia, third trimester: Secondary | ICD-10-CM

## 2020-05-01 DIAGNOSIS — Z20822 Contact with and (suspected) exposure to covid-19: Secondary | ICD-10-CM | POA: Diagnosis present

## 2020-05-01 DIAGNOSIS — O99210 Obesity complicating pregnancy, unspecified trimester: Secondary | ICD-10-CM | POA: Diagnosis present

## 2020-05-01 DIAGNOSIS — O1404 Mild to moderate pre-eclampsia, complicating childbirth: Secondary | ICD-10-CM | POA: Diagnosis present

## 2020-05-01 DIAGNOSIS — O09519 Supervision of elderly primigravida, unspecified trimester: Secondary | ICD-10-CM

## 2020-05-01 DIAGNOSIS — O1400 Mild to moderate pre-eclampsia, unspecified trimester: Secondary | ICD-10-CM | POA: Diagnosis not present

## 2020-05-01 DIAGNOSIS — O99013 Anemia complicating pregnancy, third trimester: Secondary | ICD-10-CM | POA: Diagnosis present

## 2020-05-01 DIAGNOSIS — B951 Streptococcus, group B, as the cause of diseases classified elsewhere: Secondary | ICD-10-CM | POA: Diagnosis present

## 2020-05-01 HISTORY — DX: Anemia, unspecified: D64.9

## 2020-05-01 LAB — CBC
HCT: 30.8 % — ABNORMAL LOW (ref 36.0–46.0)
Hemoglobin: 9.8 g/dL — ABNORMAL LOW (ref 12.0–15.0)
MCH: 23.1 pg — ABNORMAL LOW (ref 26.0–34.0)
MCHC: 31.8 g/dL (ref 30.0–36.0)
MCV: 72.5 fL — ABNORMAL LOW (ref 80.0–100.0)
Platelets: 317 10*3/uL (ref 150–400)
RBC: 4.25 MIL/uL (ref 3.87–5.11)
RDW: 18.5 % — ABNORMAL HIGH (ref 11.5–15.5)
WBC: 12.8 10*3/uL — ABNORMAL HIGH (ref 4.0–10.5)
nRBC: 0.2 % (ref 0.0–0.2)

## 2020-05-01 LAB — COMPREHENSIVE METABOLIC PANEL
ALT: 13 U/L (ref 0–44)
AST: 18 U/L (ref 15–41)
Albumin: 2.9 g/dL — ABNORMAL LOW (ref 3.5–5.0)
Alkaline Phosphatase: 130 U/L — ABNORMAL HIGH (ref 38–126)
Anion gap: 13 (ref 5–15)
BUN: 7 mg/dL (ref 6–20)
CO2: 17 mmol/L — ABNORMAL LOW (ref 22–32)
Calcium: 9.1 mg/dL (ref 8.9–10.3)
Chloride: 106 mmol/L (ref 98–111)
Creatinine, Ser: 0.98 mg/dL (ref 0.44–1.00)
GFR, Estimated: >60 mL/min (ref >60)
Glucose, Bld: 108 mg/dL — ABNORMAL HIGH (ref 70–99)
Potassium: 4.1 mmol/L (ref 3.5–5.1)
Sodium: 136 mmol/L (ref 135–145)
Total Bilirubin: 1.1 mg/dL (ref 0.3–1.2)
Total Protein: 6.2 g/dL — ABNORMAL LOW (ref 6.5–8.1)

## 2020-05-01 LAB — RPR: RPR Ser Ql: NONREACTIVE

## 2020-05-01 LAB — PROTEIN / CREATININE RATIO, URINE
Creatinine, Urine: 183.93 mg/dL
Protein Creatinine Ratio: 0.59 mg/mg{Cre} — ABNORMAL HIGH (ref 0.00–0.15)
Total Protein, Urine: 108 mg/dL

## 2020-05-01 LAB — TYPE AND SCREEN
ABO/RH(D): A POS
Antibody Screen: NEGATIVE

## 2020-05-01 LAB — HIV ANTIBODY (ROUTINE TESTING W REFLEX): HIV Screen 4th Generation wRfx: NONREACTIVE

## 2020-05-01 LAB — RESP PANEL BY RT-PCR (FLU A&B, COVID) ARPGX2
Influenza A by PCR: NEGATIVE
Influenza B by PCR: NEGATIVE
SARS Coronavirus 2 by RT PCR: NEGATIVE

## 2020-05-01 MED ORDER — LACTATED RINGERS IV SOLN
500.0000 mL | Freq: Once | INTRAVENOUS | Status: AC
  Administered 2020-05-01: 500 mL via INTRAVENOUS

## 2020-05-01 MED ORDER — DIPHENHYDRAMINE HCL 25 MG PO CAPS: 25.0000 mg | ORAL_CAPSULE | Freq: Four times a day (QID) | ORAL | Status: DC | PRN

## 2020-05-01 MED ORDER — TETANUS-DIPHTH-ACELL PERTUSSIS 5-2.5-18.5 LF-MCG/0.5 IM SUSY: 0.5000 mL | PREFILLED_SYRINGE | Freq: Once | INTRAMUSCULAR | Status: DC

## 2020-05-01 MED ORDER — DIPHENHYDRAMINE HCL 50 MG/ML IJ SOLN: 12.5000 mg | INTRAMUSCULAR | Status: DC | PRN

## 2020-05-01 MED ORDER — SIMETHICONE 80 MG PO CHEW: 80.0000 mg | CHEWABLE_TABLET | ORAL | Status: DC | PRN

## 2020-05-01 MED ORDER — FENTANYL CITRATE (PF) 100 MCG/2ML IJ SOLN
INTRAMUSCULAR | Status: AC
  Administered 2020-05-01: 100 ug via INTRAVENOUS
  Filled 2020-05-01: qty 2

## 2020-05-01 MED ORDER — WITCH HAZEL-GLYCERIN EX PADS: 1.0000 "application " | MEDICATED_PAD | CUTANEOUS | Status: DC | PRN

## 2020-05-01 MED ORDER — SOD CITRATE-CITRIC ACID 500-334 MG/5ML PO SOLN: 30.0000 mL | ORAL | Status: DC | PRN

## 2020-05-01 MED ORDER — LACTATED RINGERS IV SOLN
500.0000 mL | INTRAVENOUS | Status: DC | PRN
  Administered 2020-05-01: 500 mL via INTRAVENOUS

## 2020-05-01 MED ORDER — OXYTOCIN-SODIUM CHLORIDE 30-0.9 UT/500ML-% IV SOLN
1.0000 m[IU]/min | INTRAVENOUS | Status: DC
  Administered 2020-05-01: 2 m[IU]/min via INTRAVENOUS
  Filled 2020-05-01: qty 500

## 2020-05-01 MED ORDER — ONDANSETRON HCL 4 MG/2ML IJ SOLN: 4.0000 mg | Freq: Four times a day (QID) | INTRAMUSCULAR | Status: DC | PRN

## 2020-05-01 MED ORDER — LACTATED RINGERS IV SOLN: INTRAVENOUS | Status: DC

## 2020-05-01 MED ORDER — FENTANYL-BUPIVACAINE-NACL 0.5-0.125-0.9 MG/250ML-% EP SOLN
12.0000 mL/h | EPIDURAL | Status: DC | PRN
  Filled 2020-05-01: qty 250

## 2020-05-01 MED ORDER — BENZOCAINE-MENTHOL 20-0.5 % EX AERO
1.0000 "application " | INHALATION_SPRAY | CUTANEOUS | Status: DC | PRN
  Administered 2020-05-02: 1 via TOPICAL
  Filled 2020-05-01: qty 56

## 2020-05-01 MED ORDER — ACETAMINOPHEN 325 MG PO TABS: 650.0000 mg | ORAL_TABLET | ORAL | Status: DC | PRN

## 2020-05-01 MED ORDER — PHENYLEPHRINE 40 MCG/ML (10ML) SYRINGE FOR IV PUSH (FOR BLOOD PRESSURE SUPPORT): 80.0000 ug | PREFILLED_SYRINGE | INTRAVENOUS | Status: DC | PRN

## 2020-05-01 MED ORDER — IBUPROFEN 600 MG PO TABS
600.0000 mg | ORAL_TABLET | Freq: Four times a day (QID) | ORAL | Status: DC
  Administered 2020-05-01 – 2020-05-03 (×7): 600 mg via ORAL
  Filled 2020-05-01 (×8): qty 1

## 2020-05-01 MED ORDER — ONDANSETRON HCL 4 MG/2ML IJ SOLN: 4.0000 mg | INTRAMUSCULAR | Status: DC | PRN

## 2020-05-01 MED ORDER — LIDOCAINE HCL (PF) 1 % IJ SOLN: 30.0000 mL | INTRAMUSCULAR | Status: DC | PRN

## 2020-05-01 MED ORDER — ONDANSETRON HCL 4 MG PO TABS: 4.0000 mg | ORAL_TABLET | ORAL | Status: DC | PRN

## 2020-05-01 MED ORDER — EPHEDRINE 5 MG/ML INJ: 10.0000 mg | INTRAVENOUS | Status: DC | PRN

## 2020-05-01 MED ORDER — OXYTOCIN-SODIUM CHLORIDE 30-0.9 UT/500ML-% IV SOLN
2.5000 [IU]/h | INTRAVENOUS | Status: DC
  Administered 2020-05-01: 2.5 [IU]/h via INTRAVENOUS

## 2020-05-01 MED ORDER — FENTANYL CITRATE (PF) 100 MCG/2ML IJ SOLN
100.0000 ug | INTRAMUSCULAR | Status: DC | PRN
  Administered 2020-05-01: 100 ug via INTRAVENOUS
  Filled 2020-05-01: qty 2

## 2020-05-01 MED ORDER — OXYCODONE-ACETAMINOPHEN 5-325 MG PO TABS: 2.0000 | ORAL_TABLET | ORAL | Status: DC | PRN

## 2020-05-01 MED ORDER — OXYTOCIN BOLUS FROM INFUSION
333.0000 mL | Freq: Once | INTRAVENOUS | Status: AC
  Administered 2020-05-01: 333 mL via INTRAVENOUS

## 2020-05-01 MED ORDER — PRENATAL MULTIVITAMIN CH
1.0000 | ORAL_TABLET | Freq: Every day | ORAL | Status: DC
  Administered 2020-05-02 – 2020-05-03 (×2): 1 via ORAL
  Filled 2020-05-01 (×2): qty 1

## 2020-05-01 MED ORDER — DIBUCAINE (PERIANAL) 1 % EX OINT: 1.0000 "application " | TOPICAL_OINTMENT | CUTANEOUS | Status: DC | PRN

## 2020-05-01 MED ORDER — OXYCODONE-ACETAMINOPHEN 5-325 MG PO TABS: 1.0000 | ORAL_TABLET | ORAL | Status: DC | PRN

## 2020-05-01 MED ORDER — MEASLES, MUMPS & RUBELLA VAC IJ SOLR: 0.5000 mL | Freq: Once | INTRAMUSCULAR | Status: DC

## 2020-05-01 MED ORDER — LIDOCAINE HCL (PF) 1 % IJ SOLN
INTRAMUSCULAR | Status: DC | PRN
  Administered 2020-05-01: 10 mL via EPIDURAL

## 2020-05-01 MED ORDER — PENICILLIN G POT IN DEXTROSE 60000 UNIT/ML IV SOLN
3.0000 10*6.[IU] | INTRAVENOUS | Status: DC
  Administered 2020-05-01 (×2): 3 10*6.[IU] via INTRAVENOUS
  Filled 2020-05-01 (×2): qty 50

## 2020-05-01 MED ORDER — COCONUT OIL OIL: 1.0000 "application " | TOPICAL_OIL | Status: DC | PRN

## 2020-05-01 MED ORDER — SODIUM CHLORIDE 0.9 % IV SOLN
5.0000 10*6.[IU] | Freq: Once | INTRAVENOUS | Status: AC
  Administered 2020-05-01: 5 10*6.[IU] via INTRAVENOUS
  Filled 2020-05-01: qty 5

## 2020-05-01 MED ORDER — TERBUTALINE SULFATE 1 MG/ML IJ SOLN: 0.2500 mg | Freq: Once | INTRAMUSCULAR | Status: DC | PRN

## 2020-05-01 MED ORDER — SODIUM CHLORIDE (PF) 0.9 % IJ SOLN
INTRAMUSCULAR | Status: DC | PRN
  Administered 2020-05-01: 12 mL/h via EPIDURAL

## 2020-05-01 MED ORDER — SENNOSIDES-DOCUSATE SODIUM 8.6-50 MG PO TABS
2.0000 | ORAL_TABLET | ORAL | Status: DC
  Administered 2020-05-02 (×2): 2 via ORAL
  Filled 2020-05-01 (×2): qty 2

## 2020-05-01 NOTE — H&P (Signed)
OBSTETRIC ADMISSION HISTORY AND PHYSICAL  Traci Ramirez is a 34 y.o. female G2P0010 with IUP at 27w1dby LMP presenting for latent labor. She reports +FMs, No LOF, no VB, no blurry vision, headaches or peripheral edema, and RUQ pain.  She plans on breast and bottle feeding. She request POPs for birth control. [facility] --->  Estimated Date of Delivery: 04/30/20  Sono:  @[redacted]w[redacted]d , CWD, normal anatomy, cephalic presentation, 7280K 65% EFW  Prenatal History/Complications:  - silent alpha thal carrier - anemia (on oral iron) - GBS+ status - AMA  Past Medical History: Past Medical History:  Diagnosis Date  . Anemia     Past Surgical History: Past Surgical History:  Procedure Laterality Date  . NO PAST SURGERIES      Obstetrical History: OB History    Gravida  2   Para      Term      Preterm      AB  1   Living        SAB  1   TAB      Ectopic      Multiple      Live Births              Social History Social History   Socioeconomic History  . Marital status: Single    Spouse name: Not on file  . Number of children: Not on file  . Years of education: Not on file  . Highest education level: Not on file  Occupational History  . Not on file  Tobacco Use  . Smoking status: Never Smoker  . Smokeless tobacco: Never Used  Vaping Use  . Vaping Use: Never used  Substance and Sexual Activity  . Alcohol use: Never  . Drug use: Never  . Sexual activity: Yes    Birth control/protection: None  Other Topics Concern  . Not on file  Social History Narrative  . Not on file   Social Determinants of Health   Financial Resource Strain:   . Difficulty of Paying Living Expenses: Not on file  Food Insecurity:   . Worried About RCharity fundraiserin the Last Year: Not on file  . Ran Out of Food in the Last Year: Not on file  Transportation Needs:   . Lack of Transportation (Medical): Not on file  . Lack of  Transportation (Non-Medical): Not on file  Physical Activity:   . Days of Exercise per Week: Not on file  . Minutes of Exercise per Session: Not on file  Stress:   . Feeling of Stress : Not on file  Social Connections:   . Frequency of Communication with Friends and Family: Not on file  . Frequency of Social Gatherings with Friends and Family: Not on file  . Attends Religious Services: Not on file  . Active Member of Clubs or Organizations: Not on file  . Attends CArchivistMeetings: Not on file  . Marital Status: Not on file    Family History: Family History  Problem Relation Age of Onset  . Lung cancer Mother   . Brain cancer Mother     Allergies: No Known Allergies  Medications Prior to Admission  Medication Sig Dispense Refill Last Dose  . Ferrous Fumarate (HEMOCYTE - 106 MG FE) 324 (106 Fe) MG TABS tablet Take 1 tablet (106 mg of iron total) by mouth every other day. 30 tablet 2 04/30/2020 at Unknown  time  . Prenatal Vit-Fe Fumarate-FA (PRENATAL VITAMIN PO) Take by mouth.   04/30/2020 at Unknown time  . Blood Pressure Monitoring (BLOOD PRESSURE KIT) DEVI 1 kit by Does not apply route once a week. Check Blood Pressure regularly and record readings into the Babyscripts App.  Large Cuff.  DX O90.0 1 each 0      Review of Systems   All systems reviewed and negative except as stated in HPI  Blood pressure 137/80, pulse 96, temperature 98.1 F (36.7 C), temperature source Oral, resp. rate 20, last menstrual period 07/25/2019, SpO2 100 %. General appearance: alert, cooperative and appears stated age Lungs: normal WOB Heart: regular rate and rhythm Abdomen: soft, non-tender Extremities: no sign of DVT Presentation: cephalic on bedside ultrasound Fetal monitoringBaseline: 140 bpm, Variability: Good {> 6 bpm), Accelerations: Reactive and Decelerations: Absent Uterine activityFrequency: every 5-8 minutes Dilation: 4 Effacement (%): 80 Station: -3 Exam by:: Wilhemena Durie RN   Prenatal labs: ABO, Rh: A/Positive/-- (06/15 1116) Antibody: Negative (06/15 1116) Rubella: 1.35 (06/15 1116) RPR: Non Reactive (09/10 0945)  HBsAg: Negative (06/15 1116)  HIV: Non Reactive (09/10 0945)  GBS: Positive/-- (11/11 1032)  2 hr Glucola wnl Genetic screening  wnl Anatomy US wnl  Prenatal Transfer Tool  Maternal Diabetes: No Genetic Screening: Normal except for silent alpha thal carrier Maternal Ultrasounds/Referrals: Normal Fetal Ultrasounds or other Referrals:  None Maternal Substance Abuse:  No Significant Maternal Medications:  None Significant Maternal Lab Results: Group B Strep positive  No results found for this or any previous visit (from the past 24 hour(s)).  Patient Active Problem List   Diagnosis Date Noted  . Positive GBS test 04/08/2020  . Anemia during pregnancy in third trimester 02/17/2020  . Maternal obesity affecting pregnancy, antepartum 12/09/2019  . AMA (advanced maternal age) primigravida 35+ 12/09/2019  . Alpha thalassemia silent carrier 12/08/2019  . Supervision of normal pregnancy, antepartum 10/19/2019    Assessment/Plan:  Traci Ramirez is a 34 y.o. G2P0010 at 90w1dhere for management of post-dates spontaneous labor.  #Labor: Will augment labor as clinically indicated. #Pain: plan for epidural per pt request #FWB: Category 1 strip #ID: GBS positive >PCN initiated on admission #MOF: breast & bottle #MOC: POPs #Circ: n/a #Anemia: Hgb 9.8 on 11/29. Currently on po iron supplement. F/u CBC on admission. #Alpha thal carrier: peds aware #Cervical Cancer Screening: needs postpartum pap  Traci Ramirez, AGildardo Cranker MD OB Fellow, [facility] 05/01/2020 3:09 AM

## 2020-05-01 NOTE — Anesthesia Preprocedure Evaluation (Signed)
Anesthesia Evaluation  Patient identified by MRN, date of birth, ID band Patient awake    Reviewed: Allergy & Precautions, H&P , NPO status , Patient's Chart, lab work & pertinent test results  Airway Mallampati: III       Dental no notable dental hx.    Pulmonary neg pulmonary ROS,    Pulmonary exam normal        Cardiovascular negative cardio ROS Normal cardiovascular exam     Neuro/Psych negative neurological ROS  negative psych ROS   GI/Hepatic negative GI ROS, Neg liver ROS,   Endo/Other  Morbid obesity  Renal/GU negative Renal ROS  negative genitourinary   Musculoskeletal negative musculoskeletal ROS (+)   Abdominal (+) + obese,   Peds  Hematology  (+) Blood dyscrasia, anemia ,   Anesthesia Other Findings   Reproductive/Obstetrics (+) Pregnancy                             Anesthesia Physical Anesthesia Plan  ASA: III  Anesthesia Plan: Epidural   Post-op Pain Management:    Induction:   PONV Risk Score and Plan:   Airway Management Planned:   Additional Equipment:   Intra-op Plan:   Post-operative Plan:   Informed Consent: I have reviewed the patients History and Physical, chart, labs and discussed the procedure including the risks, benefits and alternatives for the proposed anesthesia with the patient or authorized representative who has indicated his/her understanding and acceptance.       Plan Discussed with:   Anesthesia Plan Comments:         Anesthesia Quick Evaluation

## 2020-05-01 NOTE — Anesthesia Procedure Notes (Signed)
Epidural Patient location during procedure: OB Start time: 05/01/2020 4:55 AM End time: 05/01/2020 4:58 AM  Staffing Anesthesiologist: Leilani Able, MD Performed: anesthesiologist   Preanesthetic Checklist Completed: patient identified, IV checked, site marked, risks and benefits discussed, surgical consent, monitors and equipment checked, pre-op evaluation and timeout performed  Epidural Patient position: sitting Prep: DuraPrep and site prepped and draped Patient monitoring: continuous pulse ox and blood pressure Approach: midline Location: L3-L4 Injection technique: LOR air  Needle:  Needle type: Tuohy  Needle gauge: 17 G Needle length: 9 cm and 9 Needle insertion depth: 8 cm Catheter type: closed end flexible Catheter size: 19 Gauge Catheter at skin depth: 14 cm Test dose: negative and Other  Assessment Events: blood not aspirated, injection not painful, no injection resistance, no paresthesia and negative IV test  Additional Notes Reason for block:procedure for pain

## 2020-05-01 NOTE — Progress Notes (Signed)
Labor Progress Note Traci Ramirez is a 34 y.o. G2P0010 at [redacted]w[redacted]d presented for management of post dates spontaneous labor.   S: Patient doing well. No complaints or concerns.    O:  BP 125/72   Pulse 85   Temp 98 F (36.7 C)   Resp 18   LMP 07/25/2019 (Exact Date)   SpO2 100%  EFM: 140/moderate variability/+accels, intermittent late decels  CVE: Dilation: 4 Effacement (%): 90 Station: -3 Presentation: Vertex Exam by:: Cherene Altes RN  A&P: Traci Ramirez is a 34 y.o. G2P0010 at [redacted]w[redacted]d here for management of post-dates spontaneous labor.  #Labor: Progressing well. Will continue to titrate pitocin. BP upper limit of normal. Patient is asymptomatic. Will have PreE labs drawn.  #Pain: plan for epidural per pt request #FWB: cat 2, but reassured with variability on strip. Improved with patient repositioning.  #GBS positive> PCN complete  #Anemia: Hgb 9.8 on 11/29. Currently on po iron supplement. F/u CBC on admission. #Alpha thal carrier: peds aware #Cervical Cancer Screening: needs postpartum pap  Traci Slade, MD 8:59 AM

## 2020-05-01 NOTE — Discharge Summary (Addendum)
Postpartum Discharge Summary  Date of Service updated12/8/21     Patient Name: Traci Ramirez DOB: Sep 29, 1985 MRN: [id number]  Date of admission: 05/01/2020 Delivery date:05/01/2020  Delivering provider: Layla Barter  Date of discharge: 05/02/2020  Admitting diagnosis: Supervision of low-risk pregnancy, third trimester [Z34.93] Intrauterine pregnancy: [redacted]w[redacted]d    Secondary diagnosis:  Active Problems:   Supervision of normal pregnancy, antepartum   Alpha thalassemia silent carrier   Maternal obesity affecting pregnancy, antepartum   AMA (advanced maternal age) primigravida 35+   Anemia during pregnancy in third trimester   Positive GBS test   Pre-eclampsia, mild   Vaginal delivery   Second degree perineal laceration  Additional problems: none    Discharge diagnosis: Term Pregnancy Delivered, Preeclampsia (mild) and Anemia                                              Post partum procedures:none Augmentation: Pitocin Complications: None  Hospital course: Onset of Labor With Vaginal Delivery      34y.o. yo G2P1011 at 498w1das admitted in Latent Labor on 05/01/2020. Patient had an uncomplicated labor course as follows:  Membrane Rupture Time/Date: 3:15 AM ,05/01/2020   Delivery Method:Vaginal, Spontaneous  Episiotomy: None  Lacerations:  2nd degree  Patient had an uncomplicated postpartum course.  She is ambulating, tolerating a regular diet, passing flatus, and urinating well. Patient is discharged home in stable condition on 05/02/20.  Newborn Data: Birth date:05/01/2020  Birth time:3:36 PM  Gender:Female  Living status:Living  Apgars:9 ,9  Weight:3385 g   Magnesium Sulfate received: No BMZ received: No Rhophylac:N/A MMR:N/A T-DaP:Given prenatally Flu: Yes Transfusion:No  Physical exam  Vitals:   05/01/20 1808 05/01/20 1933 05/02/20 0006 05/02/20 0420  BP: 123/79 126/74 116/80 114/70  Pulse: 85 (!) 104 77 90  Resp: 18 19 18 18   Temp: 97.7 F (36.5 C)    97.9 F (36.6 C)  TempSrc: Oral   Oral  SpO2:   100% 100%   General: alert, cooperative and no distress Lochia: appropriate Uterine Fundus: firm Incision: N/A DVT Evaluation: No evidence of DVT seen on physical exam. Labs: Lab Results  Component Value Date   WBC 12.8 (H) 05/01/2020   HGB 9.8 (L) 05/01/2020   HCT 30.8 (L) 05/01/2020   MCV 72.5 (L) 05/01/2020   PLT 317 05/01/2020   CMP Latest Ref Rng & Units 05/01/2020  Glucose 70 - 99 mg/dL 108(H)  BUN 6 - 20 mg/dL 7  Creatinine 0.44 - 1.00 mg/dL 0.98  Sodium 135 - 145 mmol/L 136  Potassium 3.5 - 5.1 mmol/L 4.1  Chloride 98 - 111 mmol/L 106  CO2 22 - 32 mmol/L 17(L)  Calcium 8.9 - 10.3 mg/dL 9.1  Total Protein 6.5 - 8.1 g/dL 6.2(L)  Total Bilirubin 0.3 - 1.2 mg/dL 1.1  Alkaline Phos 38 - 126 U/L 130(H)  AST 15 - 41 U/L 18  ALT 0 - 44 U/L 13   Edinburgh Score: No flowsheet data found.   After visit meds:  Allergies as of 05/02/2020   No Known Allergies     Medication List    STOP taking these medications   acetaminophen 500 MG tablet Commonly known as: TYLENOL   aspirin EC 81 MG tablet     TAKE these medications   Blood Pressure Kit Devi 1 kit by Does not  apply route once a week. Check Blood Pressure regularly and record readings into the Babyscripts App.  Large Cuff.  DX O90.0   Ferrous Fumarate 324 (106 Fe) MG Tabs tablet Commonly known as: HEMOCYTE - 106 mg FE Take 1 tablet (106 mg of iron total) by mouth every other day.   ibuprofen 600 MG tablet Commonly known as: ADVIL Take 1 tablet (600 mg total) by mouth every 6 (six) hours.   PRENATAL VITAMIN PO Take by mouth.        Discharge home in stable condition Infant Feeding: Bottle and Breast Infant Disposition:home with mother Discharge instruction: per After Visit Summary and Postpartum booklet. Activity: Advance as tolerated. Pelvic rest for 6 weeks.  Diet: routine diet Future Appointments: Future Appointments  Date Time Provider  [place]  05/02/2020  1:45 PM Griffin Basil, MD [place] None   Follow up Visit:  [place]. Schedule an appointment as soon as possible for a visit in 1 week(s).   Specialty: Obstetrics and Gynecology Why: Blood Pressure check 1 week then postpartum check 4 weeks Contact information: [address] [place] [phone]             Message sent to [facility] 05/01/20.   Please schedule this patient for a In person postpartum visit in 4 weeks with the following provider: Any provider. Additional Postpartum F/U:BP check 1 week  Low risk pregnancy complicated by: uncomplicated, but preE developed upon admission. Delivery mode:  Vaginal, Spontaneous  Anticipated Birth Control:  POPs, rx sent to pharmacy at discharge.   05/02/2020 Hansel Feinstein, CNM

## 2020-05-01 NOTE — Progress Notes (Signed)
Labor Progress Note Tosca Pletz is a 34 y.o. G2P0010 at [redacted]w[redacted]d presented for management of post dates spontaneous labor.   S: Patient doing well. No complaints or concerns.    O:  BP (!) 145/103   Pulse (!) 105   Temp 98 F (36.7 C)   Resp 18   LMP 07/25/2019 (Exact Date)   SpO2 100%  EFM: 145/moderate variability/+accels, intermittent variable decels  CVE: Dilation: 6 Effacement (%): 100 Station: -1, 0 Presentation: Vertex Exam by:: Viona Gilmore Rn  A&P: Jheri Mitter is a 34 y.o. G2P0010 at [redacted]w[redacted]d here for management of post-dates spontaneous labor.   #Labor: Patient is progressing well. Will continue to titrate pitocin.  #PreE: UPC 0.59. BP mild range with one severe range pressure. She is asymptomatic. Will continue to monitor.  #Pain: plan for epidural per pt request #FWB: cat 2, but reassured with variability on strip. Improved with patient repositioning.  #GBS positive> PCN complete  #Anemia: Hgb 9.8 on 11/29. Currently on po iron supplement. F/u CBC on admission. #Alpha thal carrier: peds aware #Cervical Cancer Screening: needs postpartum pap  [name], MD 12:30 PM

## 2020-05-01 NOTE — Lactation Note (Signed)
This note was copied from a baby's chart. Lactation Consultation Note  Patient Name: Traci Ramirez [id number] Date: 05/01/2020 Reason for consult: 1st time breastfeeding;Term;Initial assessment P1, 7 hour term female infant. Infant had one void diaper since delivery. Per mom, she took a breastfeeding class in [facility] during her pregnancy online.  Per mom, she has Medela DEBP at home. Mom feels breastfeeding is going well, infant has latched twice since delivery, 10 minutes in L&D and 15 minutes in MBU. Per mom, she felt a tug but no pain with latch. LC did not observe latch, mom recently finish breastfeeding infant and infant was asleep on mom's chest but not STS. LC discussed benefits of STS and mom will start doing STS with infant as much as possible. Mom doesn't have any questions or concerns for LC at this time. LC did give mom a hand pump with 27 mm breast flange to pre-pump breast prior to latching infant due to mom being short shafted. LC discussed hand expression and mom taught back, expressing 3 mls of colostrum in a bullet that she will offer to infant at next feeding, after she latches infant to breast first. Mom knows to ask RN for assistance with spoon feeding infant the EBM.  Mom understands to BF infant according to hunger cues, 8 to 12+ times within 24 hours, STS. Mom knows to call RN or LC if she has any questions, concerns or needs assistance with latching infant at the breast. LC suggested mom attend the [facility] Breastfeeding Support Group after discharge ( free) within the local community. Mom made aware of O/P services, breastfeeding support groups, community resources, and our phone # for post-discharge questions.   Maternal Data Formula Feeding for Exclusion: Yes Reason for exclusion: Mother's choice to formula and breast feed on admission Has patient been taught Hand Expression?: Yes Does the patient have breastfeeding experience prior to this delivery?: No   Feeding    LATCH Score                   Interventions Interventions: Skin to skin;Breast massage;Hand express;Expressed milk;Hand pump  Lactation Tools Discussed/Used Tools: Pump Breast pump type: Manual WIC Program: Yes Pump Review: Setup, frequency, and cleaning;Milk Storage Initiated by:: Danelle Earthly, IBCLC Date initiated:: 05/01/20   Consult Status Consult Status: Follow-up Date: 05/02/20 Follow-up type: In-patient    Danelle Earthly 05/01/2020, 11:04 PM

## 2020-05-01 NOTE — MAU Note (Signed)
Pt arrived EMS with ctx 3-5 minutes apart. 3cm in MAU yesterday. No LOF or bleeding. +FM.

## 2020-05-02 ENCOUNTER — Encounter: Payer: Medicaid Other | Admitting: Obstetrics and Gynecology

## 2020-05-02 MED ORDER — IBUPROFEN 600 MG PO TABS: 600.0000 mg | ORAL_TABLET | Freq: Four times a day (QID) | ORAL | 0 refills | Status: DC

## 2020-05-02 MED ORDER — NORETHINDRONE 0.35 MG PO TABS: 1.0000 | ORAL_TABLET | Freq: Every day | ORAL | 11 refills | Status: DC

## 2020-05-02 NOTE — Progress Notes (Signed)
Post Partum Day 1 Subjective: Traci Ramirez id a 34 yo G2P1011 s/p VD. She reports no adverse events overnight. She has been able tolerate PO intake with no issues. No problems voiding, no BM at this time, but she took some stool softeners. Pain is well controlled. Patient has been able to walk around the floor. Lochia is decreasing. Patient is concerned she is not producing enough milk, but is working with the Advertising copywriter. Patient asked about different birth control options specifically the Depo injection, but after being presented the options decided to continue with POPs postpartum.   Objective: Blood pressure 114/70, pulse 90, temperature 97.9 F (36.6 C), temperature source Oral, resp. rate 18, last menstrual period 07/25/2019, SpO2 100 %, unknown if currently breastfeeding.  Physical Exam:  General: alert, appears stated age and no distress Lochia: appropriate Uterine Fundus: firm DVT Evaluation: No evidence of DVT seen on physical exam. - Negative Homan's sign. - No cords or calf tenderness. - No significant calf/ankle edema.  Recent Labs    05/01/20 0410  HGB 9.8*  HCT 30.8*    Assessment/Plan: 1. Postpartum s/p VD 2. Anemia during pregnancy in third trimester  - Labs have not been drawn on 05/02/20. Admission Hgb was 9.8 3. GBS + 4. Pre-eclampsia, mild  - Continue monitoring BP  Plan for discharge Breastfeeding concerns from patient, but working with lactation consultation Contraception decided upon is POPs   LOS: 1 day   Colman Cater PA-S 05/02/2020, 7:37 AM

## 2020-05-02 NOTE — Lactation Note (Signed)
This note was copied from a baby's chart. Lactation Consultation Note  Patient Name: Traci Ramirez [id number] Date: 05/02/2020 Reason for consult: Follow-up assessment  Follow up visit to 23 hours old infant with 1.77% weight loss at the time of this visit of a P1 mother. Mother states infant is having difficulty latching. Mother is supplementing with formula.  Infant has been been having good voids and stools, per mother.  Talked to mother about hand expression and attempted to demonstrated. Nipples are edematous and unable to express any colostrum at this time. LC encouraged mother to used shells to help with swelling.   Mother requests assistance with latch. Unswaddled infant and set up support pillows for football position to left breast. Demonstrated alignment. Infant is sleepy and uninterested to latch. Encouraged mother to call LC to assist with latch once infant is awake and showing hunger cues.   Educated mom about paced bottle feeding and demonstrated technique.   Feeding plan:  1. Breastfeed following hunger cues.  2. Stimulate infant awake at the breast 3. Offer breast 8 -  12 times in 24h period to establish good milk supply.   4. If needed supplement with formula following guidelines, pace bottle feeding and fullness cues.   5. Encouraged maternal rest, hydration and food intake.  6. Contact Lactation Services or local resources for support, questions or concerns.    All questions answered at this time.   Maternal Data Formula Feeding for Exclusion: Yes Reason for exclusion: Mother's choice to formula and breast feed on admission Has patient been taught Hand Expression?: Yes Does the patient have breastfeeding experience prior to this delivery?: No  Feeding Feeding Type: Breast Fed  LATCH Score Latch: Too sleepy or reluctant, no latch achieved, no sucking elicited.  Audible Swallowing: None  Type of Nipple: Flat  Comfort (Breast/Nipple): Filling, red/small  blisters or bruises, mild/mod discomfort  Hold (Positioning): Assistance needed to correctly position infant at breast and maintain latch.  LATCH Score: 3  Interventions Interventions: Breast feeding basics reviewed;Assisted with latch;Skin to skin;Hand express;Breast massage;Adjust position;Position options;Support pillows;Expressed milk;Shells;Hand pump  Lactation Tools Discussed/Used Tools: Shells;Pump Shell Type: Other (comment) (edematous nipples) Breast pump type: Manual   Consult Status Consult Status: Follow-up Date: 05/03/20 Follow-up type: In-patient    Gift Rueckert A Higuera Ancidey 05/02/2020, 3:12 PM

## 2020-05-02 NOTE — Anesthesia Postprocedure Evaluation (Signed)
Anesthesia Post Note  Patient: Traci Ramirez  Procedure(s) Performed: AN AD HOC LABOR EPIDURAL     Patient location during evaluation: Mother Baby Anesthesia Type: Epidural Level of consciousness: awake, awake and alert and oriented Pain management: pain level controlled Vital Signs Assessment: post-procedure vital signs reviewed and stable Respiratory status: spontaneous breathing, nonlabored ventilation and respiratory function stable Cardiovascular status: stable Postop Assessment: no headache, patient able to bend at knees, no apparent nausea or vomiting, adequate PO intake, able to ambulate and no backache Anesthetic complications: no   No complications documented.  Last Vitals:  Vitals:   05/02/20 0006 05/02/20 0420  BP: 116/80 114/70  Pulse: 77 90  Resp: 18 18  Temp:  36.6 C  SpO2: 100% 100%    Last Pain:  Vitals:   05/02/20 0420  TempSrc: Oral  PainSc: 0-No pain   Pain Goal:                   Suni Jarnagin

## 2020-05-03 DIAGNOSIS — O99215 Obesity complicating the puerperium: Secondary | ICD-10-CM

## 2020-05-03 DIAGNOSIS — Z148 Genetic carrier of other disease: Secondary | ICD-10-CM

## 2020-05-03 DIAGNOSIS — Z8759 Personal history of other complications of pregnancy, childbirth and the puerperium: Secondary | ICD-10-CM

## 2020-05-03 DIAGNOSIS — O1405 Mild to moderate pre-eclampsia, complicating the puerperium: Secondary | ICD-10-CM

## 2020-05-03 DIAGNOSIS — O9903 Anemia complicating the puerperium: Secondary | ICD-10-CM

## 2020-05-03 DIAGNOSIS — D649 Anemia, unspecified: Secondary | ICD-10-CM

## 2020-05-03 MED ORDER — AMLODIPINE BESYLATE 5 MG PO TABS: 5.0000 mg | ORAL_TABLET | Freq: Every day | ORAL | 2 refills | Status: DC

## 2020-05-03 NOTE — Lactation Note (Signed)
This note was copied from a baby's chart. Lactation Consultation Note  Patient Name: Traci Ramirez [id number] Date: 05/03/2020 Reason for consult: Follow-up assessment  P1 mother whose infant is now 52 hours old.  This is a term baby at 40+1 weeks.  Mother's feeding preference is breast/bottle.  Baby was asleep in the bassinet when I arrived.  Mother has been primarily formula feeding but informed me that she plans to breast feed more at home.  Educated on the importance of putting her to the breast first prior to any formula supplementation.  Mother has been wearing breast shells for edema which has improved.  Engorgement prevention/treatment discussed.  Mother has a manual pump and a DEBP for home use.  She had a few questions regarding milk storage.  Father present.   Maternal Data    Feeding Feeding Type: Formula Nipple Type: Slow - flow  LATCH Score                   Interventions    Lactation Tools Discussed/Used     Consult Status Consult Status: Complete Date: 05/03/20 Follow-up type: Call as needed    Traci Ramirez R Flora Ratz 05/03/2020, 7:44 AM

## 2020-05-03 NOTE — Discharge Instructions (Signed)
Postpartum Care After Vaginal Delivery This sheet gives you information about how to care for yourself from the time you deliver your baby to up to 6-12 weeks after delivery (postpartum period). Your health care provider may also give you more specific instructions. If you have problems or questions, contact your health care provider. Follow these instructions at home: Vaginal bleeding  It is normal to have vaginal bleeding (lochia) after delivery. Wear a sanitary pad for vaginal bleeding and discharge. ? During the first week after delivery, the amount and appearance of lochia is often similar to a menstrual period. ? Over the next few weeks, it will gradually decrease to a dry, yellow-brown discharge. ? For most women, lochia stops completely by 4-6 weeks after delivery. Vaginal bleeding can vary from woman to woman.  Change your sanitary pads frequently. Watch for any changes in your flow, such as: ? A sudden increase in volume. ? A change in color. ? Large blood clots.  If you pass a blood clot from your vagina, save it and call your health care provider to discuss. Do not flush blood clots down the toilet before talking with your health care provider.  Do not use tampons or douches until your health care provider says this is safe.  If you are not breastfeeding, your period should return 6-8 weeks after delivery. If you are feeding your child breast milk only (exclusive breastfeeding), your period may not return until you stop breastfeeding. Perineal care  Keep the area between the vagina and the anus (perineum) clean and dry as told by your health care provider. Use medicated pads and pain-relieving sprays and creams as directed.  If you had a cut in the perineum (episiotomy) or a tear in the vagina, check the area for signs of infection until you are healed. Check for: ? More redness, swelling, or pain. ? Fluid or blood coming from the cut or tear. ? Warmth. ? Pus or a bad  smell.  You may be given a squirt bottle to use instead of wiping to clean the perineum area after you go to the bathroom. As you start healing, you may use the squirt bottle before wiping yourself. Make sure to wipe gently.  To relieve pain caused by an episiotomy, a tear in the vagina, or swollen veins in the anus (hemorrhoids), try taking a warm sitz bath 2-3 times a day. A sitz bath is a warm water bath that is taken while you are sitting down. The water should only come up to your hips and should cover your buttocks. Breast care  Within the first few days after delivery, your breasts may feel heavy, full, and uncomfortable (breast engorgement). Milk may also leak from your breasts. Your health care provider can suggest ways to help relieve the discomfort. Breast engorgement should go away within a few days.  If you are breastfeeding: ? Wear a bra that supports your breasts and fits you well. ? Keep your nipples clean and dry. Apply creams and ointments as told by your health care provider. ? You may need to use breast pads to absorb milk that leaks from your breasts. ? You may have uterine contractions every time you breastfeed for up to several weeks after delivery. Uterine contractions help your uterus return to its normal size. ? If you have any problems with breastfeeding, work with your health care provider or lactation consultant.  If you are not breastfeeding: ? Avoid touching your breasts a lot. Doing this can make   your breasts produce more milk. ? Wear a good-fitting bra and use cold packs to help with swelling. ? Do not squeeze out (express) milk. This causes you to make more milk. Intimacy and sexuality  Ask your health care provider when you can engage in sexual activity. This may depend on: ? Your risk of infection. ? How fast you are healing. ? Your comfort and desire to engage in sexual activity.  You are able to get pregnant after delivery, even if you have not had  your period. If desired, talk with your health care provider about methods of birth control (contraception). Medicines  Take over-the-counter and prescription medicines only as told by your health care provider.  If you were prescribed an antibiotic medicine, take it as told by your health care provider. Do not stop taking the antibiotic even if you start to feel better. Activity  Gradually return to your normal activities as told by your health care provider. Ask your health care provider what activities are safe for you.  Rest as much as possible. Try to rest or take a nap while your baby is sleeping. Eating and drinking   Drink enough fluid to keep your urine pale yellow.  Eat high-fiber foods every day. These may help prevent or relieve constipation. High-fiber foods include: ? Whole grain cereals and breads. ? Brown rice. ? Beans. ? Fresh fruits and vegetables.  Do not try to lose weight quickly by cutting back on calories.  Take your prenatal vitamins until your postpartum checkup or until your health care provider tells you it is okay to stop. Lifestyle  Do not use any products that contain nicotine or tobacco, such as cigarettes and e-cigarettes. If you need help quitting, ask your health care provider.  Do not drink alcohol, especially if you are breastfeeding. General instructions  Keep all follow-up visits for you and your baby as told by your health care provider. Most women visit their health care provider for a postpartum checkup within the first 3-6 weeks after delivery. Contact a health care provider if:  You feel unable to cope with the changes that your child brings to your life, and these feelings do not go away.  You feel unusually sad or worried.  Your breasts become red, painful, or hard.  You have a fever.  You have trouble holding urine or keeping urine from leaking.  You have little or no interest in activities you used to enjoy.  You have not  breastfed at all and you have not had a menstrual period for 12 weeks after delivery.  You have stopped breastfeeding and you have not had a menstrual period for 12 weeks after you stopped breastfeeding.  You have questions about caring for yourself or your baby.  You pass a blood clot from your vagina. Get help right away if:  You have chest pain.  You have difficulty breathing.  You have sudden, severe leg pain.  You have severe pain or cramping in your lower abdomen.  You bleed from your vagina so much that you fill more than one sanitary pad in one hour. Bleeding should not be heavier than your heaviest period.  You develop a severe headache.  You faint.  You have blurred vision or spots in your vision.  You have bad-smelling vaginal discharge.  You have thoughts about hurting yourself or your baby. If you ever feel like you may hurt yourself or others, or have thoughts about taking your own life, get help   right away. You can go to the nearest emergency department or call:  Your local emergency services (911 in the [place]  A suicide crisis helpline, such as the National Suicide Prevention Lifeline at 1-800-273-8255. This is open 24 hours a day. Summary  The period of time right after you deliver your newborn up to 6-12 weeks after delivery is called the postpartum period.  Gradually return to your normal activities as told by your health care provider.  Keep all follow-up visits for you and your baby as told by your health care provider. This information is not intended to replace advice given to you by your health care provider. Make sure you discuss any questions you have with your health care provider. Document Revised: 05/15/2017 Document Reviewed: 02/23/2017 Elsevier Patient Education  2020 [employer] Postpartum Hypertension Postpartum hypertension is high blood pressure that remains higher than normal after childbirth. You may not realize that you have  postpartum hypertension if your blood pressure is not being checked regularly. In most cases, postpartum hypertension will go away on its own, usually within a week of delivery. However, for some women, medical treatment is required to prevent serious complications, such as seizures or stroke. What are the causes? This condition may be caused by one or more of the following:  Hypertension that existed before pregnancy (chronic hypertension).  Hypertension that comes on as a result of pregnancy (gestational hypertension).  Hypertensive disorders during pregnancy (preeclampsia) or seizures in women who have high blood pressure during pregnancy (eclampsia).  A condition in which the liver, platelets, and red blood cells are damaged during pregnancy (HELLP syndrome).  A condition in which the thyroid produces too much hormones (hyperthyroidism).  Other rare problems of the nerves (neurological disorders) or blood disorders. In some cases, the cause may not be known. What increases the risk? The following factors may make you more likely to develop this condition:  Chronic hypertension. In some cases, this may not have been diagnosed before pregnancy.  Obesity.  Type 2 diabetes.  Kidney disease.  History of preeclampsia or eclampsia.  Other medical conditions that change the level of hormones in the body (hormonal imbalance). What are the signs or symptoms? As with all types of hypertension, postpartum hypertension may not have any symptoms. Depending on how high your blood pressure is, you may experience:  Headaches. These may be mild, moderate, or severe. They may also be steady, constant, or sudden in onset (thunderclap headache).  Changes in your ability to see (visual changes).  Dizziness.  Shortness of breath.  Swelling of your hands, feet, lower legs, or face. In some cases, you may have swelling in more than one of these locations.  Heart palpitations or a racing  heartbeat.  Difficulty breathing while lying down.  Decrease in the amount of urine that you pass. Other rare signs and symptoms may include:  Sweating more than usual. This lasts longer than a few days after delivery.  Chest pain.  Sudden dizziness when you get up from sitting or lying down.  Seizures.  Nausea or vomiting.  Abdominal pain. How is this diagnosed? This condition may be diagnosed based on the results of a physical exam, blood pressure measurements, and blood and urine tests. You may also have other tests, such as a CT scan or an MRI, to check for other problems of postpartum hypertension. How is this treated? If blood pressure is high enough to require treatment, your options may include:  Medicines to reduce blood pressure (  antihypertensives). Tell your health care provider if you are breastfeeding or if you plan to breastfeed. There are many antihypertensive medicines that are safe to take while breastfeeding.  Stopping medicines that may be causing hypertension.  Treating medical conditions that are causing hypertension.  Treating the complications of hypertension, such as seizures, stroke, or kidney problems. Your health care provider will also continue to monitor your blood pressure closely until it is within a safe range for you. Follow these instructions at home:  Take over-the-counter and prescription medicines only as told by your health care provider.  Return to your normal activities as told by your health care provider. Ask your health care provider what activities are safe for you.  Do not use any products that contain nicotine or tobacco, such as cigarettes and e-cigarettes. If you need help quitting, ask your health care provider.  Keep all follow-up visits as told by your health care provider. This is important. Contact a health care provider if:  Your symptoms get worse.  You have new symptoms, such as: ? A headache that does not get  better. ? Dizziness. ? Visual changes. Get help right away if:  You suddenly develop swelling in your hands, ankles, or face.  You have sudden, rapid weight gain.  You develop difficulty breathing, chest pain, racing heartbeat, or heart palpitations.  You develop severe pain in your abdomen.  You have any symptoms of a stroke. "BE FAST" is an easy way to remember the main warning signs of a stroke: ? B - Balance. Signs are dizziness, sudden trouble walking, or loss of balance. ? E - Eyes. Signs are trouble seeing or a sudden change in vision. ? F - Face. Signs are sudden weakness or numbness of the face, or the face or eyelid drooping on one side. ? A - Arms. Signs are weakness or numbness in an arm. This happens suddenly and usually on one side of the body. ? S - Speech. Signs are sudden trouble speaking, slurred speech, or trouble understanding what people say. ? T - Time. Time to call emergency services. Write down what time symptoms started.  You have other signs of a stroke, such as: ? A sudden, severe headache with no known cause. ? Nausea or vomiting. ? Seizure. These symptoms may represent a serious problem that is an emergency. Do not wait to see if the symptoms will go away. Get medical help right away. Call your local emergency services (911 in the [place] Do not drive yourself to the hospital. Summary  Postpartum hypertension is high blood pressure that remains higher than normal after childbirth.  In most cases, postpartum hypertension will go away on its own, usually within a week of delivery.  For some women, medical treatment is required to prevent serious complications, such as seizures or stroke. This information is not intended to replace advice given to you by your health care provider. Make sure you discuss any questions you have with your health care provider. Document Revised: 06/18/2018 Document Reviewed: 03/02/2017 Elsevier Patient Education  2020 Elsevier  Inc.  

## 2020-05-03 NOTE — Discharge Summary (Addendum)
Postpartum Discharge Summary  Patient Name: Traci Ramirez DOB: [birth year] MRN: [id number]  Date of admission: 05/01/2020 Delivery date:05/01/2020  Delivering provider: Layla Barter  Date of discharge: 05/03/2020  Admitting diagnosis: Supervision of low-risk pregnancy, third trimester [Z34.93] Intrauterine pregnancy: [redacted]w[redacted]d    Secondary diagnosis:  Active Problems:   Supervision of normal pregnancy, antepartum   Alpha thalassemia silent carrier   Maternal obesity affecting pregnancy, antepartum   AMA (advanced maternal age) primigravida 35+   Anemia during pregnancy in third trimester   Positive GBS test   Pre-eclampsia, mild   Vaginal delivery   Second degree perineal laceration  Additional problems: none    Discharge diagnosis: Term Pregnancy Delivered, Preeclampsia (mild) and Anemia                                              Post partum procedures:none Augmentation: Pitocin Complications: None  Hospital course: Onset of Labor With Vaginal Delivery      34y.o. yo G2P1011 at 480w1das admitted in Latent Labor on 05/01/2020. Patient had an uncomplicated labor course as follows:  Membrane Rupture Time/Date: 3:15 AM ,05/01/2020   Delivery Method:Vaginal, Spontaneous  Episiotomy: None  Lacerations:  2nd degree  Patient had an uncomplicated postpartum course.  Borderline BP's, started on amlodipine 52m22mt discharge. She is ambulating, tolerating a regular diet, passing flatus, and urinating well. Patient is discharged home in stable condition on 05/03/20.  Newborn Data: Birth date:05/01/2020  Birth time:3:36 PM  Gender:Female  Living status:Living  Apgars:9 ,9  Weight:3385 g   Magnesium Sulfate received: No BMZ received: No Rhophylac:N/A MMR:N/A T-DaP:Given prenatally Flu: Yes Transfusion:No  Physical exam  Vitals:   05/02/20 0420 05/02/20 1430 05/02/20 2031 05/03/20 0529  BP: 114/70 115/71 124/81 126/89  Pulse: 90 95 98 79  Resp: 18 18 18 18   Temp: 97.9  F (36.6 C) 98.3 F (36.8 C) 98.1 F (36.7 C) 98 F (36.7 C)  TempSrc: Oral Oral Oral Oral  SpO2: 100%  100% 100%   General: alert, cooperative and no distress Lochia: appropriate Uterine Fundus: firm Incision: N/A DVT Evaluation: No evidence of DVT seen on physical exam. Labs: Lab Results  Component Value Date   WBC 12.8 (H) 05/01/2020   HGB 9.8 (L) 05/01/2020   HCT 30.8 (L) 05/01/2020   MCV 72.5 (L) 05/01/2020   PLT 317 05/01/2020   CMP Latest Ref Rng & Units 05/01/2020  Glucose 70 - 99 mg/dL 108(H)  BUN 6 - 20 mg/dL 7  Creatinine 0.44 - 1.00 mg/dL 0.98  Sodium 135 - 145 mmol/L 136  Potassium 3.5 - 5.1 mmol/L 4.1  Chloride 98 - 111 mmol/L 106  CO2 22 - 32 mmol/L 17(L)  Calcium 8.9 - 10.3 mg/dL 9.1  Total Protein 6.5 - 8.1 g/dL 6.2(L)  Total Bilirubin 0.3 - 1.2 mg/dL 1.1  Alkaline Phos 38 - 126 U/L 130(H)  AST 15 - 41 U/L 18  ALT 0 - 44 U/L 13   Edinburgh Score: Edinburgh Postnatal Depression Scale Screening Tool 05/02/2020  I have been able to laugh and see the funny side of things. 0  I have looked forward with enjoyment to things. 0  I have blamed myself unnecessarily when things went wrong. 0  I have been anxious or worried for no good reason. 0  I have felt scared or panicky for  no good reason. 0  Things have been getting on top of me. 1  I have been so unhappy that I have had difficulty sleeping. 0  I have felt sad or miserable. 0  I have been so unhappy that I have been crying. 0  The thought of harming myself has occurred to me. 0  Edinburgh Postnatal Depression Scale Total 1     After visit meds:  Allergies as of 05/03/2020   No Known Allergies     Medication List    STOP taking these medications   acetaminophen 500 MG tablet Commonly known as: TYLENOL   aspirin EC 81 MG tablet     TAKE these medications   amLODipine 5 MG tablet Commonly known as: NORVASC Take 1 tablet (5 mg total) by mouth daily.   Blood Pressure Kit Devi 1 kit by Does  not apply route once a week. Check Blood Pressure regularly and record readings into the [employer] App.  Large Cuff.  DX O90.0   Ferrous Fumarate 324 (106 Fe) MG Tabs tablet Commonly known as: HEMOCYTE - 106 mg FE Take 1 tablet (106 mg of iron total) by mouth every other day.   ibuprofen 600 MG tablet Commonly known as: ADVIL Take 1 tablet (600 mg total) by mouth every 6 (six) hours.   norethindrone 0.35 MG tablet Commonly known as: MICRONOR Take 1 tablet (0.35 mg total) by mouth daily.   PRENATAL VITAMIN PO Take by mouth.      Discharge home in stable condition Infant Feeding: Bottle and Breast Infant Disposition:home with mother Discharge instruction: per After Visit Summary and Postpartum booklet. Activity: Advance as tolerated. Pelvic rest for 6 weeks.  Diet: routine diet   Future Appointments: Future Appointments  Date Time Provider [place]  05/09/2020  1:40 PM [place] None  05/30/2020  2:00 PM Sloan Leiter, MD [place] None   Follow up Visit:  Follow-up [place]. Schedule an appointment as soon as possible for a visit in 1 week(s).   Specialty: Obstetrics and Gynecology Why: Blood Pressure check 1 week then postpartum check 4 weeks Contact information: [address] [place] [phone]             Message sent to [facility] 05/01/20.  Please schedule this patient for a In person postpartum visit in 4 weeks with the following provider: Any provider. Additional Postpartum F/U:BP check 1 week  Low risk pregnancy complicated by: uncomplicated, but preE developed upon admission. Delivery mode:  Vaginal, Spontaneous  Anticipated Birth Control:  POPs, rx sent to pharmacy at discharge.   05/03/2020 Clarnce Flock, MD

## 2020-05-09 ENCOUNTER — Ambulatory Visit: Payer: Medicaid Other

## 2020-05-10 ENCOUNTER — Ambulatory Visit (INDEPENDENT_AMBULATORY_CARE_PROVIDER_SITE_OTHER): Payer: Medicaid Other

## 2020-05-10 ENCOUNTER — Other Ambulatory Visit: Payer: Self-pay

## 2020-05-10 VITALS — BP 125/83 | HR 67

## 2020-05-10 DIAGNOSIS — Z0130 Encounter for examination of blood pressure without abnormal findings: Secondary | ICD-10-CM

## 2020-05-10 NOTE — Progress Notes (Signed)
Patient was assessed and managed by nursing staff during this encounter. I have reviewed the chart and agree with the documentation and plan. I have also made any necessary editorial changes.  Catalina Antigua, MD 05/10/2020 12:00 PM

## 2020-05-10 NOTE — Progress Notes (Signed)
Subjective:  Traci Ramirez is a 34 y.o. female here for BP check s/p NSVD on 05/01/2020. Pt took Amlodipine 5 mg this morning at 8 am.  Hypertension ROS: taking medications as instructed, no medication side effects noted, no TIA's, no chest pain on exertion, no dyspnea on exertion and no swelling of ankles   Objective:  BP 125/83   Pulse 67   Appearance alert, well appearing, and in no distress. General exam BP noted to be well controlled today in office.    Assessment:   Blood Pressure well controlled.   Plan:  Keep upcoming appointment

## 2020-05-30 ENCOUNTER — Encounter: Payer: Self-pay | Admitting: Obstetrics and Gynecology

## 2020-05-30 ENCOUNTER — Ambulatory Visit (INDEPENDENT_AMBULATORY_CARE_PROVIDER_SITE_OTHER): Payer: Medicaid Other | Admitting: Obstetrics and Gynecology

## 2020-05-30 ENCOUNTER — Other Ambulatory Visit: Payer: Self-pay

## 2020-05-30 DIAGNOSIS — Z30017 Encounter for initial prescription of implantable subdermal contraceptive: Secondary | ICD-10-CM | POA: Diagnosis not present

## 2020-05-30 DIAGNOSIS — Z3009 Encounter for other general counseling and advice on contraception: Secondary | ICD-10-CM

## 2020-05-30 DIAGNOSIS — Z8759 Personal history of other complications of pregnancy, childbirth and the puerperium: Secondary | ICD-10-CM

## 2020-05-30 DIAGNOSIS — Z3202 Encounter for pregnancy test, result negative: Secondary | ICD-10-CM

## 2020-05-30 LAB — POCT URINE PREGNANCY: Preg Test, Ur: NEGATIVE

## 2020-05-30 MED ORDER — ETONOGESTREL 68 MG ~~LOC~~ IMPL
68.0000 mg | DRUG_IMPLANT | Freq: Once | SUBCUTANEOUS | Status: AC
  Administered 2020-05-30: 68 mg via SUBCUTANEOUS

## 2020-05-30 NOTE — Addendum Note (Signed)
Addended by: Natale Milch D on: 05/30/2020 02:37 PM   Modules accepted: Orders

## 2020-05-30 NOTE — Addendum Note (Signed)
Addended by: Maretta Bees on: 05/30/2020 02:47 PM   Modules accepted: Orders

## 2020-05-30 NOTE — Progress Notes (Signed)
     GYNECOLOGY OFFICE PROCEDURE NOTE  Traci Ramirez is a 35 y.o. G2P1011 here for Nexplanon insertion.  Last pap smear was on 2020 and was normal, per patient. No other gynecologic concerns. Denies unprotected intercourse within the last 14 days. UPT: negative  Reviewed risks of insertion of implant including risk of infection, bleeding, damage to surrounding tissues and organs, migration of implant, difficult removal. She verbalizes understanding and affirms desire to proceed. Consent signed.   Nexplanon Insertion Procedure Patient identified, informed consent performed, consent signed.   Patient does understand that irregular bleeding is a very common side effect of this medication. She was advised to have backup contraception for one week after placement. Pregnancy test in clinic today was negative.  An adequate timeout was performed.  Patient's left arm was prepped and draped in the usual sterile fashion. The ruler used to measure and mark insertion area.  Patient was prepped with alcohol swab and then injected with 3 ml of 1% lidocaine.  She was prepped with betadine, Nexplanon removed from packaging,  Device confirmed in needle, then inserted full length of needle and withdrawn per handbook instructions. Nexplanon was able to palpated in the patient's arm; patient palpated the insert herself. There was minimal blood loss.  Patient insertion site covered with gauze and a pressure bandage to reduce any bruising.  The patient tolerated the procedure well and was given post procedure instructions.    Device Info Exp: 09/02/2022 Lot#: [id number]  K. Therese Sarah, MD, [facility] Attending Center for [facility])

## 2020-05-30 NOTE — Progress Notes (Signed)
Obstetrics/Postpartum Visit  Appointment Date: 05/30/2020  OBGYN Clinic: [facility]  Primary Care Provider: Patient, No Pcp Per  Chief Complaint:  Chief Complaint  Patient presents with  . Postpartum Care    History of Present Illness: Traci Ramirez is a 35 y.o. African-American G2P1011 (No LMP recorded.), seen for the above chief complaint. Her past medical history is significant for anemia.   She is s/p spontaneous vaginal delivery on 05/01/20 at 40 weeks; she was discharged to home on PPD#2. Pregnancy complicated by alpha thal carrier, anemia, AMA, GBS+.  Complains of nothing.  Vaginal bleeding or discharge: No  Breast or formula feeding: bottle Intercourse: No  Contraception: POP PP depression s/s: No  Any bowel or bladder issues: No  Pap smear: no abnormalities (date: 2020 - per patient, normal)  Review of Systems: Positive for n/a.   Her 12 point review of systems is negative or as noted in the History of Present Illness.  Patient Active Problem List   Diagnosis Date Noted  . Pre-eclampsia, mild 05/01/2020  . Vaginal delivery 05/01/2020  . Second degree perineal laceration 05/01/2020  . Positive GBS test 04/08/2020  . Anemia during pregnancy in third trimester 02/17/2020  . Maternal obesity affecting pregnancy, antepartum 12/09/2019  . AMA (advanced maternal age) primigravida 35+ 12/09/2019  . Alpha thalassemia silent carrier 12/08/2019  . Supervision of normal pregnancy, antepartum 10/19/2019    Medications Rosanna Randy had no medications administered during this visit. Current Outpatient Medications  Medication Sig Dispense Refill  . amLODipine (NORVASC) 5 MG tablet Take 1 tablet (5 mg total) by mouth daily. 30 tablet 2  . Blood Pressure Monitoring (BLOOD PRESSURE KIT) DEVI 1 kit by Does not apply route once a week. Check Blood Pressure regularly and record readings into the [employer] App.  Large Cuff.  DX O90.0 1 each 0  . Ferrous Fumarate (HEMOCYTE - 106  MG FE) 324 (106 Fe) MG TABS tablet Take 1 tablet (106 mg of iron total) by mouth every other day. (Patient not taking: Reported on 05/10/2020) 30 tablet 2  . ibuprofen (ADVIL) 600 MG tablet Take 1 tablet (600 mg total) by mouth every 6 (six) hours. 30 tablet 0  . norethindrone (MICRONOR) 0.35 MG tablet Take 1 tablet (0.35 mg total) by mouth daily. (Patient not taking: Reported on 05/10/2020) 30 tablet 11  . Prenatal Vit-Fe Fumarate-FA (PRENATAL VITAMIN PO) Take by mouth.     No current facility-administered medications for this visit.    Allergies Patient has no known allergies.   Physical Exam:  BP 113/75   Pulse 90   Wt 242 lb (109.8 kg)   Breastfeeding No   BMI 39.06 kg/m  Body mass index is 39.06 kg/m. General appearance: Well nourished, well developed female in no acute distress.  Cardiovascular: regular rate and rhythm Respiratory:  Normal respiratory effort Abdomen: no masses, hernias; diffusely non tender to palpation, non distended,  Breasts: not examined. Neuro/Psych:  Normal mood and affect.  Skin:  Warm and dry.    PP Depression Screening:    Edinburgh Postnatal Depression Scale - 05/30/20 1353      Edinburgh Postnatal Depression Scale:  In the Past 7 Days   I have been able to laugh and see the funny side of things. 0    I have looked forward with enjoyment to things. 0    I have blamed myself unnecessarily when things went wrong. 0    I have been anxious or worried for no good  reason. 0    I have felt scared or panicky for no good reason. 0    Things have been getting on top of me. 0    I have been so unhappy that I have had difficulty sleeping. 0    I have felt sad or miserable. 0    I have been so unhappy that I have been crying. 0    The thought of harming myself has occurred to me. 0    Edinburgh Postnatal Depression Scale Total 0           Assessment: Patient is a 35 y.o. G2P1011 who is 4 weeks post partum from a spontaneous vaginal delivery. She is  doing well.   Plan:  1. Postpartum state Doing well  2. Encounter for counseling regarding contraception Reviewed options for birth control including oral contraceptive pills (combination and progesterone only), NuvaRing, Depo-Provera, Nexplanon, IUDs (copper and levonorgestrol). Thoroughly reviewed risks/benefits/side effects of each. Answered all questions. Patient with h/o of n/a and opts for Nexplanon.  3. Nexplanon insertion See note  Essential components of care per ACOG recommendations:  1.  Mood and well being: Patient with negative depression screening today. Reviewed local resources for support.  - Patient does not use tobacco.  - hx of drug use? No    2. Infant care and feeding:  -Patient currently breastmilk feeding? No  -Social determinants of health (SDOH) reviewed in EPIC. No concerns  3. Sexuality, contraception and birth spacing - Patient does not want a pregnancy in the next year.  Desired family size is unknown children.  - Reviewed forms of contraception in tiered fashion. Patient desired Nexplnaon today.   - Discussed birth spacing of 18 months  4. Sleep and fatigue -Encouraged family/partner/community support of 4 hrs of uninterrupted sleep to help with mood and fatigue  5. Physical Recovery  - Discussed patients delivery and complications - Patient had a 2nd degree laceration, perineal healing reviewed. Patient expressed understanding - Patient has urinary incontinence? No  - Patient is safe to resume physical and sexual activity  6.  Health Maintenance - Last pap smear done 2020 in [place], per patient, was negative, she will get [place] records   RTC prn   K. Arvilla Meres, MD, [facility]3)

## 2020-10-04 ENCOUNTER — Ambulatory Visit (INDEPENDENT_AMBULATORY_CARE_PROVIDER_SITE_OTHER): Payer: Medicaid Other | Admitting: Obstetrics

## 2020-10-04 ENCOUNTER — Other Ambulatory Visit: Payer: Self-pay

## 2020-10-04 ENCOUNTER — Encounter: Payer: Self-pay | Admitting: Obstetrics

## 2020-10-04 VITALS — BP 109/79 | HR 74 | Wt 269.0 lb

## 2020-10-04 DIAGNOSIS — Z3009 Encounter for other general counseling and advice on contraception: Secondary | ICD-10-CM

## 2020-10-04 DIAGNOSIS — N939 Abnormal uterine and vaginal bleeding, unspecified: Secondary | ICD-10-CM

## 2020-10-04 NOTE — Progress Notes (Signed)
Subjective:    Rylyn Zawistowski is a 35 y.o. female who presents for contraception counseling. The patient has no complaints today. The patient is sexually active. Pertinent past medical history: none.  The information documented in the HPI was reviewed and verified.  Menstrual History: OB History    Gravida  2   Para  1   Term  1   Preterm      AB  1   Living  1     SAB  1   IAB      Ectopic      Multiple  0   Live Births  1            No LMP recorded.   Patient Active Problem List   Diagnosis Date Noted  . Pre-eclampsia, mild 05/01/2020  . Vaginal delivery 05/01/2020  . Second degree perineal laceration 05/01/2020  . Positive GBS test 04/08/2020  . Anemia during pregnancy in third trimester 02/17/2020  . Maternal obesity affecting pregnancy, antepartum 12/09/2019  . AMA (advanced maternal age) primigravida 35+ 12/09/2019  . Alpha thalassemia silent carrier 12/08/2019  . Supervision of normal pregnancy, antepartum 10/19/2019   Past Medical History:  Diagnosis Date  . Anemia     Past Surgical History:  Procedure Laterality Date  . NO PAST SURGERIES       Current Outpatient Medications:  .  Ferrous Fumarate (HEMOCYTE - 106 MG FE) 324 (106 Fe) MG TABS tablet, Take 1 tablet (106 mg of iron total) by mouth every other day. (Patient not taking: No sig reported), Disp: 30 tablet, Rfl: 2 .  ibuprofen (ADVIL) 600 MG tablet, Take 1 tablet (600 mg total) by mouth every 6 (six) hours. (Patient not taking: Reported on 10/04/2020), Disp: 30 tablet, Rfl: 0 .  norethindrone (MICRONOR) 0.35 MG tablet, Take 1 tablet (0.35 mg total) by mouth daily. (Patient not taking: No sig reported), Disp: 30 tablet, Rfl: 11 .  Prenatal Vit-Fe Fumarate-FA (PRENATAL VITAMIN PO), Take by mouth. (Patient not taking: Reported on 10/04/2020), Disp: , Rfl:  No Known Allergies  Social History   Tobacco Use  . Smoking status: Never Smoker  . Smokeless tobacco: Never Used  Substance Use  Topics  . Alcohol use: Never    Family History  Problem Relation Age of Onset  . Lung cancer Mother   . Brain cancer Mother        Review of Systems Constitutional: negative for weight loss Genitourinary:negative for abnormal menstrual periods and vaginal discharge   Objective:   BP 109/79   Pulse 74   Wt 269 lb (122 kg)   BMI 43.42 kg/m    General:   alert and no distress  Skin:   no rash or abnormalities  Lungs:   clear to auscultation bilaterally  Heart:   regular rate and rhythm, S1, S2 normal, no murmur, click, rub or gallop  Remainder of physical exam deferred.     Lab Review Urine pregnancy test Labs reviewed yes Radiologic studies reviewed no  I have spent a total of 10 minutes of face-to-face time, excluding clinical staff time, reviewing notes and preparing to see patient, ordering tests and/or medications, and counseling the patient.  Assessment:    35 y.o., continuing Nexplanon, no contraindications.   Plan:  Balcoltra 28 - 1 pack dispensed to try and stabilize hormones  Follow up in office in 8 weeks   All questions answered. Diagnosis explained in detail, including differential. Discussed healthy lifestyle modifications. Agricultural engineer  distributed. Follow up in 8 weeks.   Brock Bad, MD 10/04/2020 9:50 AM

## 2020-10-04 NOTE — Progress Notes (Signed)
Pt states she is having problems since getting Nexplanon placed and she would like removed.   Pt may want to try pills.

## 2020-11-29 ENCOUNTER — Telehealth: Payer: Medicaid Other | Admitting: Obstetrics

## 2020-12-12 ENCOUNTER — Ambulatory Visit (INDEPENDENT_AMBULATORY_CARE_PROVIDER_SITE_OTHER): Payer: Medicaid Other | Admitting: Obstetrics

## 2020-12-12 ENCOUNTER — Other Ambulatory Visit: Payer: Self-pay

## 2020-12-12 ENCOUNTER — Encounter: Payer: Self-pay | Admitting: Obstetrics

## 2020-12-12 VITALS — BP 107/74 | HR 71 | Wt 273.0 lb

## 2020-12-12 DIAGNOSIS — Z3046 Encounter for surveillance of implantable subdermal contraceptive: Secondary | ICD-10-CM

## 2020-12-12 DIAGNOSIS — Z3009 Encounter for other general counseling and advice on contraception: Secondary | ICD-10-CM

## 2020-12-12 DIAGNOSIS — Z30011 Encounter for initial prescription of contraceptive pills: Secondary | ICD-10-CM

## 2020-12-12 MED ORDER — LO LOESTRIN FE 1 MG-10 MCG / 10 MCG PO TABS: 1.0000 | ORAL_TABLET | Freq: Every day | ORAL | 4 refills | Status: DC

## 2020-12-12 NOTE — Progress Notes (Signed)
RGYN patient presents for Nexplanon removal   Wants OCP  Has had period for almost 2 months now 10/19/20

## 2020-12-12 NOTE — Progress Notes (Signed)
NEXPLANON REMOVAL NOTE  Date of LMP:   unknown  Contraception used: *Nexplanon   Indications:  The patient desires removal of Nexplanon because of irregular vaginal bleeding.  She understands risks, benefits, and alternatives to Implanon and would like to proceed.  Anesthesia:   Lidocaine 1% plain.  Procedure:  A time-out was performed confirming the procedure and the patient's allergy status.  Complications: None                      The rod was palpated and the area was sterilely prepped.  The area beneath the distal tip was anesthetized with 1% xylocaine and the skin incised                       Over the tip and the tip was exposed, grasped with forcep and removed intact.  A single suture of 4-0 Vicryl was used to close incision.  Steri strip                       And a bandage applied and the arm was wrapped with gauze bandage.  The patient tolerated well.  Instructions:  The patient was instructed to remove the dressing in 24 hours and that some bruising is to be expected.  She was advised to use over the counter analgesics as needed for any pain at the site.  She is to keep the area dry for 24 hours and to call if her hand or arm becomes cold, numb, or blue.  Return visit:  Return in 2 weeks   Brock Bad, MD 12/12/2020 1:33 PM

## 2020-12-26 ENCOUNTER — Ambulatory Visit: Payer: Medicaid Other | Admitting: Obstetrics

## 2020-12-31 ENCOUNTER — Other Ambulatory Visit: Payer: Self-pay

## 2020-12-31 ENCOUNTER — Ambulatory Visit (INDEPENDENT_AMBULATORY_CARE_PROVIDER_SITE_OTHER): Payer: Medicaid Other | Admitting: Obstetrics

## 2020-12-31 ENCOUNTER — Encounter: Payer: Self-pay | Admitting: Obstetrics

## 2020-12-31 VITALS — BP 118/76 | HR 76 | Wt 262.0 lb

## 2020-12-31 DIAGNOSIS — Z9889 Other specified postprocedural states: Secondary | ICD-10-CM

## 2020-12-31 NOTE — Progress Notes (Signed)
Subjective:    Traci Ramirez is a 35 y.o. female who presents for contraception counseling. The patient has no complaints today. The patient is sexually active. Pertinent past medical history: none.  The information documented in the HPI was reviewed and verified.  Menstrual History: OB History     Gravida  2   Para  1   Term  1   Preterm      AB  1   Living  1      SAB  1   IAB      Ectopic      Multiple  0   Live Births  1         No LMP recorded.   Patient Active Problem List   Diagnosis Date Noted   Pre-eclampsia, mild 05/01/2020   Vaginal delivery 05/01/2020   Second degree perineal laceration 05/01/2020   Positive GBS test 04/08/2020   Anemia during pregnancy in third trimester 02/17/2020   Maternal obesity affecting pregnancy, antepartum 12/09/2019   AMA (advanced maternal age) primigravida 35+ 12/09/2019   Alpha thalassemia silent carrier 12/08/2019   Supervision of normal pregnancy, antepartum 10/19/2019   Past Medical History:  Diagnosis Date   Anemia     Past Surgical History:  Procedure Laterality Date   NO PAST SURGERIES       Current Outpatient Medications:    LO LOESTRIN FE 1 MG-10 MCG / 10 MCG tablet, Take 1 tablet by mouth daily., Disp: 84 tablet, Rfl: 4   Ferrous Fumarate (HEMOCYTE - 106 MG FE) 324 (106 Fe) MG TABS tablet, Take 1 tablet (106 mg of iron total) by mouth every other day. (Patient not taking: No sig reported), Disp: 30 tablet, Rfl: 2   ibuprofen (ADVIL) 600 MG tablet, Take 1 tablet (600 mg total) by mouth every 6 (six) hours. (Patient not taking: No sig reported), Disp: 30 tablet, Rfl: 0   norethindrone (MICRONOR) 0.35 MG tablet, Take 1 tablet (0.35 mg total) by mouth daily. (Patient not taking: No sig reported), Disp: 30 tablet, Rfl: 11   Prenatal Vit-Fe Fumarate-FA (PRENATAL VITAMIN PO), Take by mouth. (Patient not taking: No sig reported), Disp: , Rfl:  No Known Allergies  Social History   Tobacco Use   Smoking  status: Never   Smokeless tobacco: Never  Substance Use Topics   Alcohol use: Never    Family History  Problem Relation Age of Onset   Lung cancer Mother    Brain cancer Mother        Review of Systems Constitutional: negative for weight loss Genitourinary:negative for abnormal menstrual periods and vaginal discharge   Objective:   BP 118/76   Pulse 76   Wt 262 lb (118.8 kg)   BMI 42.29 kg/m    General:   Alert nd no distress  Skin:   no rash or abnormalities  Lungs:   clear to auscultation bilaterally  Heart:   regular rate and rhythm, S1, S2 normal, no murmur, click, rub or gallop            Left Upper Extremity: Nexplanon removal site is clean, dry, intact and non tender.     Lab Review Urine pregnancy test Labs reviewed yes Radiologic studies reviewed no  I have spent a total of 15 minutes of face-to-face time, excluding clinical staff time, reviewing notes and preparing to see patient, ordering tests and/or medications, and counseling the patient.   Assessment:    35 y.o., discontinuing Nexplanon and starting OCP's,  no contraindications.   Plan:    All questions answered. Discussed healthy lifestyle modifications. Follow up in 3 months.   Annual / Pap  Brock Bad, MD 12/31/2020 11:08 AM

## 2021-10-28 IMAGING — US US MFM OB DETAIL+14 WK
1 series · 13 of 28 positions shown · non-contrast
Comparison: none

[Series 1: us mfm ob detail+14 wk · 13 of 135 slices shown]
[im 5/135]
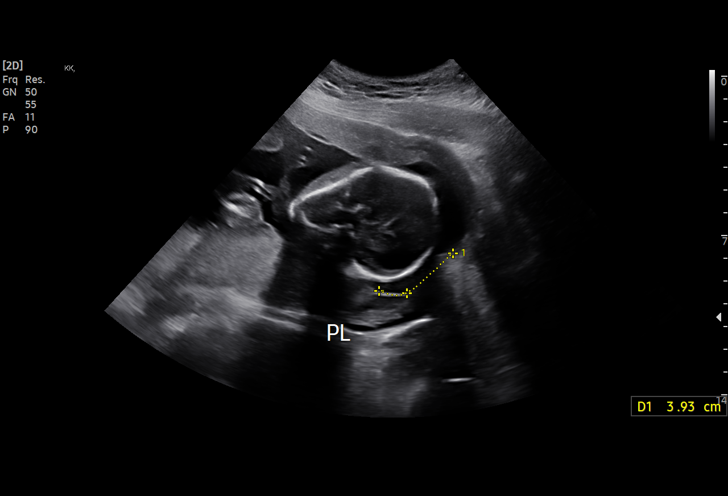
[im 15/135]
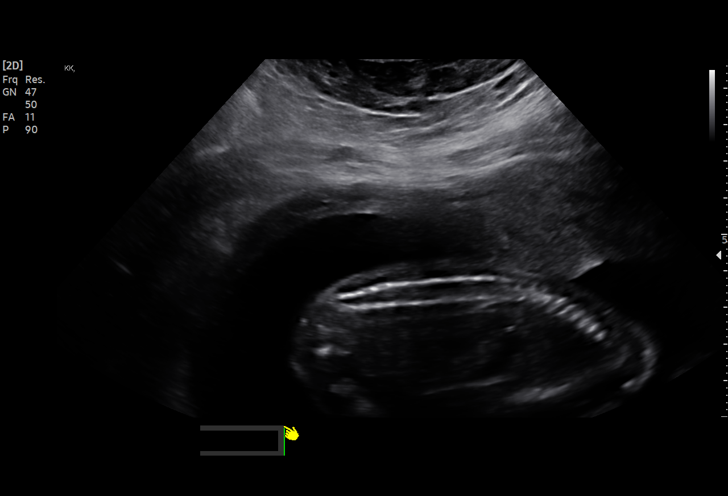
[im 25/135]
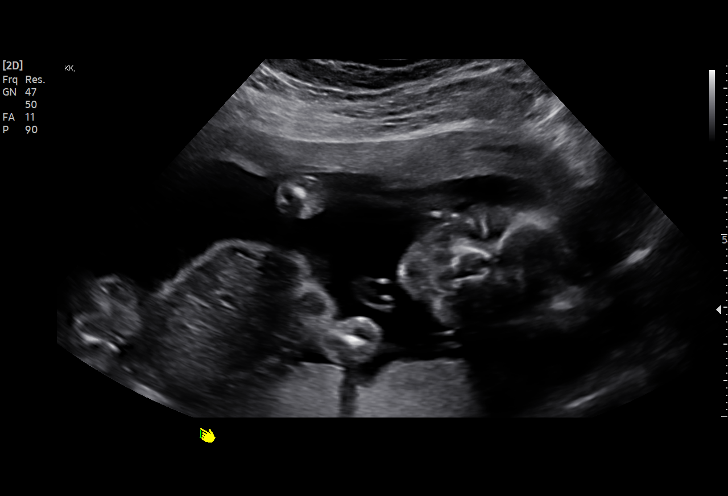
[im 35/135]
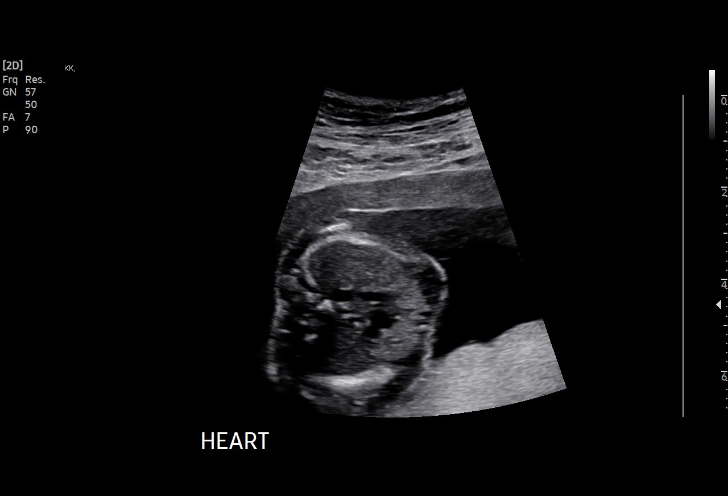
[im 45/135]
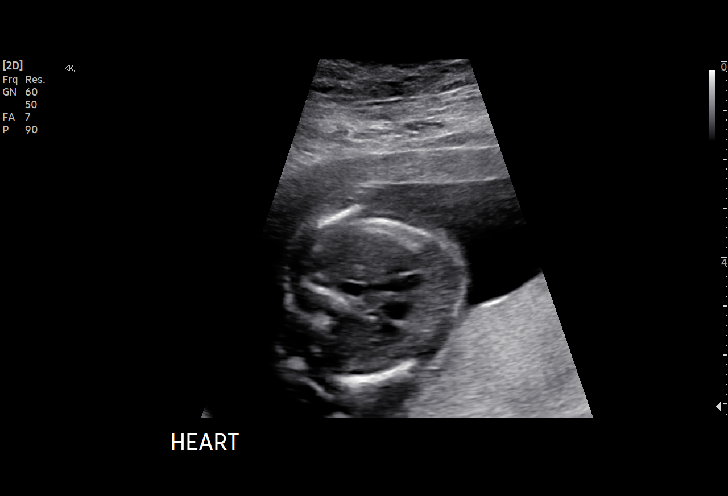
[im 55/135]
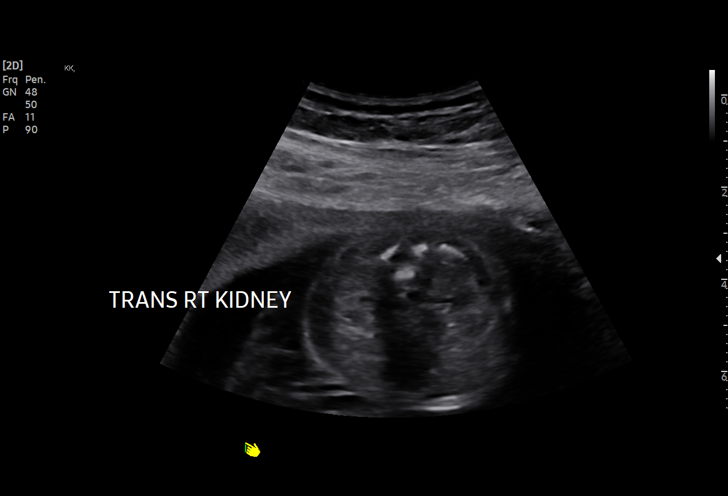
[im 70/135]
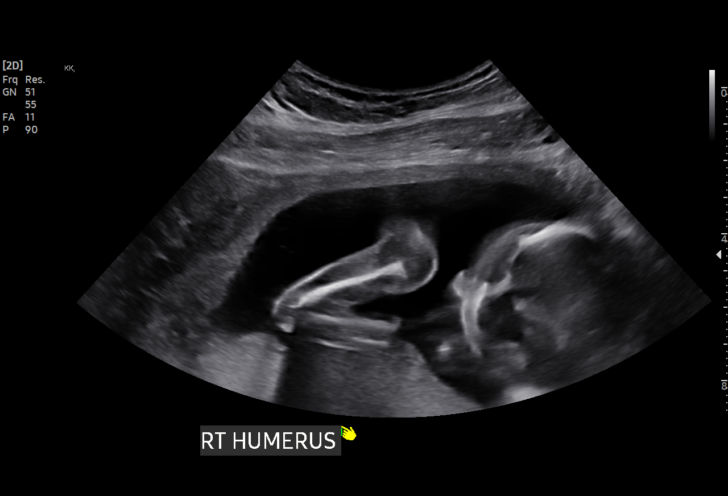
[im 80/135]
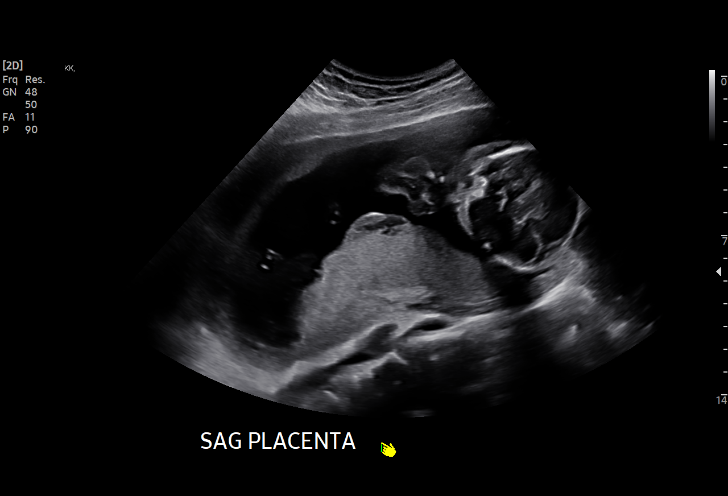
[im 90/135]
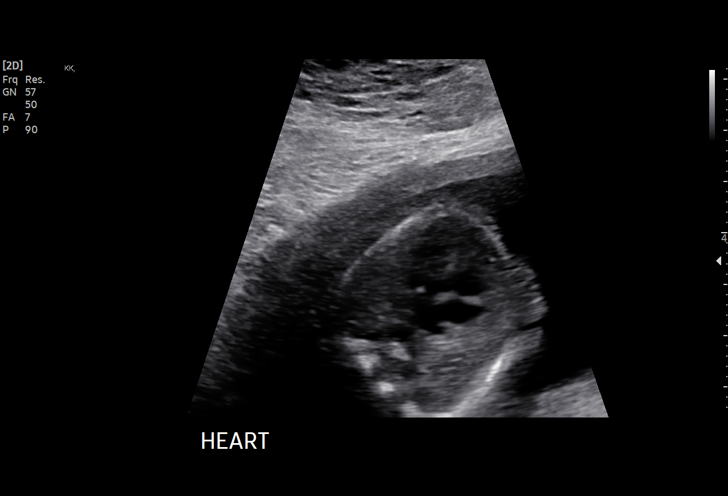
[im 100/135]
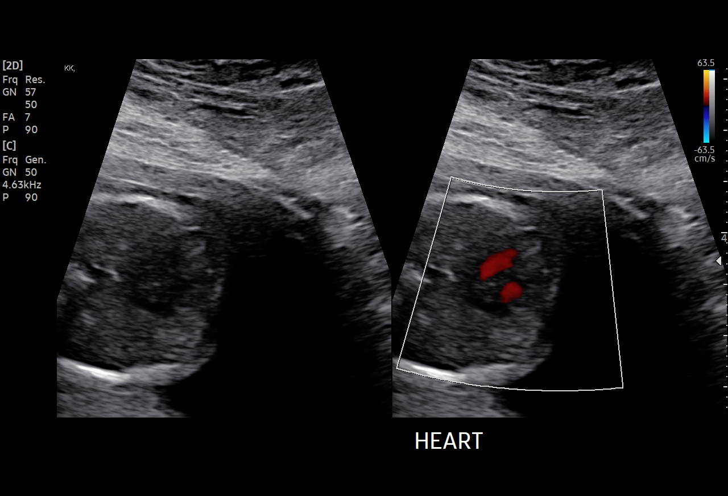
[im 110/135]
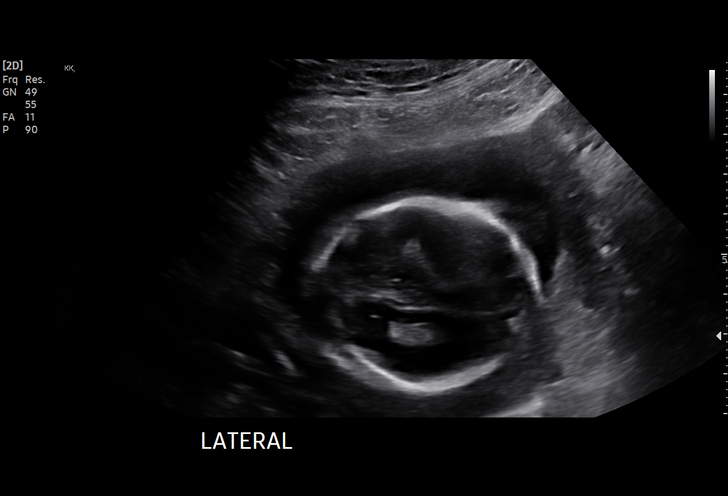
[im 120/135]
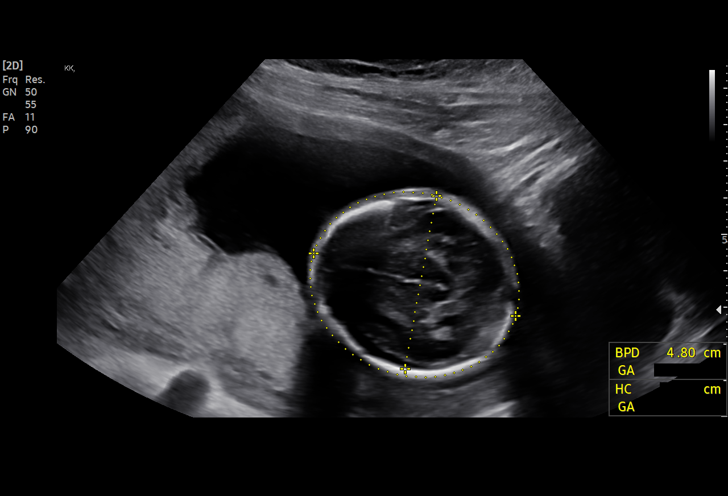
[im 130/135]
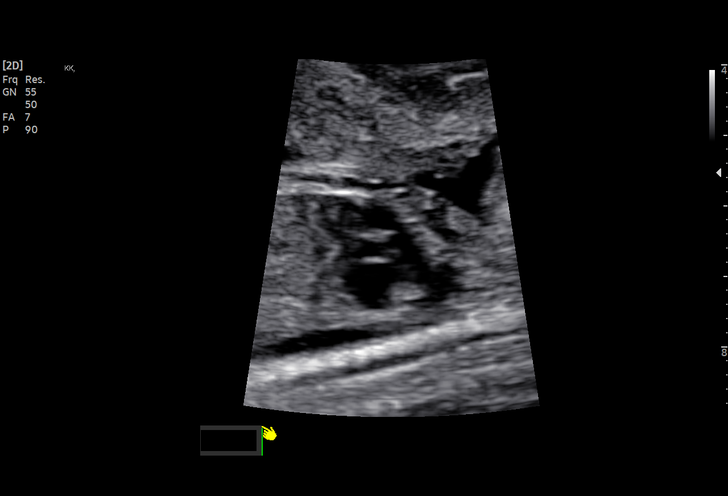

[13 of 28 positions shown; findings below may reference images not displayed]

[facility]                                 [HOSPITAL] at

Indications

 Obesity complicating pregnancy, second
 trimester (Pregravid BMI 32)
 Encounter for antenatal screening for
 malformations
 Advanced maternal age multigravida 35+,
 third trimester
 Low Risk NIPS
 Genetic carrier (Silent
 19 weeks gestation of pregnancy
Fetal Evaluation

 Num Of Fetuses:         1
 Fetal Heart Rate(bpm):  138
 Cardiac Activity:       Observed
 Presentation:           Cephalic
 Placenta:               Posterior
 P. Cord Insertion:      Not well visualized

 Amniotic Fluid
 AFI FV:      Within normal limits

                             Largest Pocket(cm)

Biometry
 BPD:      47.8  mm     G. Age:  20w 3d         84  %    CI:        79.37   %    70 - 86
                                                         FL/HC:      18.5   %    16.8 -
 HC:      169.6  mm     G. Age:  19w 4d         43  %    HC/AC:      1.15        1.09 -
 AC:      148.1  mm     G. Age:  20w 1d         62  %    FL/BPD:     65.5   %
 FL:       31.3  mm     G. Age:  19w 5d         48  %    FL/AC:      21.1   %    20 - 24
 HUM:      29.5  mm     G. Age:  19w 5d         54  %
 NFT:       3.9  mm

 Est. FW:     320  gm    0 lb 11 oz      65  %
OB History

 Gravidity:    2         Term:   0        Prem:   0        SAB:   1
 TOP:          0       Ectopic:  0        Living: 0
Gestational Age

 LMP:           19w 4d        Date:  07/25/19                 EDD:   04/30/20
 U/S Today:     20w 0d                                        EDD:   04/27/20
 Best:          19w 4d     Det. By:  LMP  (07/25/19)          EDD:   04/30/20
Anatomy

 Cranium:               Appears normal         Aortic Arch:            Appears normal
 Cavum:                 Appears normal         Ductal Arch:            Not well visualized
 Ventricles:            Appears normal         Diaphragm:              Appears normal
 Choroid Plexus:        Appears normal         Stomach:                Appears normal, left
                                                                       sided
 Cerebellum:            Appears normal         Abdomen:                Appears normal
 Posterior Fossa:       Appears normal         Abdominal Wall:         Appears nml (cord
                                                                       insert, abd wall)
 Nuchal Fold:           Appears normal         Cord Vessels:           Appears normal (3
                                                                       vessel cord)
 Face:                  Appears normal         Kidneys:                Appear normal
                        (orbits and profile)
 Lips:                  Not well visualized    Bladder:                Appears normal
 Thoracic:              Appears normal         Spine:                  Appears normal
 Heart:                 Appears normal         Upper Extremities:      Appears normal
                        (4CH, axis, and
                        situs)
 RVOT:                  Appears normal         Lower Extremities:      Appears normal
 LVOT:                  Appears normal

 Other:  Technically difficult due to fetal position.
Cervix Uterus Adnexa

 Cervix
 Length:            3.2  cm.
 Normal appearance by transabdominal scan.
Impression

 G2 P0.  Advanced maternal age.  Patient is here for fetal
 anatomy scan.
 On cell-free fetal DNA screening, the risks of fetal
 aneuploidies are not increased .

 We performed fetal anatomy scan. No makers of
 aneuploidies or fetal structural defects are seen. Fetal
 biometry is consistent with her previously-established dates.
 Amniotic fluid is normal and good fetal activity is seen.
 Patient understands the limitations of ultrasound in detecting
 fetal anomalies.
Recommendations

 -An appointment was made for her to return in 4 weeks for
 completion of fetal anatomy.
                 [place], Mohd Khairil

## 2021-11-05 ENCOUNTER — Ambulatory Visit (INDEPENDENT_AMBULATORY_CARE_PROVIDER_SITE_OTHER): Payer: Medicaid Other

## 2021-11-05 DIAGNOSIS — O26859 Spotting complicating pregnancy, unspecified trimester: Secondary | ICD-10-CM

## 2021-11-05 DIAGNOSIS — Z3200 Encounter for pregnancy test, result unknown: Secondary | ICD-10-CM

## 2021-11-05 DIAGNOSIS — Z3201 Encounter for pregnancy test, result positive: Secondary | ICD-10-CM

## 2021-11-05 LAB — POCT URINE PREGNANCY: Preg Test, Ur: POSITIVE — AB

## 2021-11-05 NOTE — Progress Notes (Signed)
..  Ms. Palomo presents today for UPT. She has no unusual complaints and reports brown spotting on 10/24/21 for a few days that stopped and started again 11/03/21 and has continued. LMP:09/23/2021    OBJECTIVE: Appears well, in no apparent distress.  OB History     Gravida  2   Para  1   Term  1   Preterm      AB  1   Living  1      SAB  1   IAB      Ectopic      Multiple  0   Live Births  1          Home UPT Result:Positive In-Office UPT result:Positive I have reviewed the patient's medical, obstetrical, social, and family histories, and medications.   ASSESSMENT: Positive pregnancy test  PLAN Prenatal care to be completed at: [facility] with Dr. Debroah Loop who advised that patient get HCG labs done today.Office will follow up with results.

## 2021-11-06 ENCOUNTER — Inpatient Hospital Stay (HOSPITAL_COMMUNITY): Payer: Medicaid Other

## 2021-11-06 ENCOUNTER — Inpatient Hospital Stay (HOSPITAL_COMMUNITY)
Admission: AD | Admit: 2021-11-06 | Discharge: 2021-11-06 | Disposition: A | Discharge status: Home / Self Care | Payer: Medicaid Other | Attending: Obstetrics and Gynecology | Admitting: Obstetrics and Gynecology

## 2021-11-06 ENCOUNTER — Other Ambulatory Visit: Payer: Self-pay

## 2021-11-06 ENCOUNTER — Encounter (HOSPITAL_COMMUNITY): Payer: Self-pay | Admitting: Obstetrics and Gynecology

## 2021-11-06 ENCOUNTER — Telehealth: Payer: Self-pay | Admitting: *Deleted

## 2021-11-06 DIAGNOSIS — O200 Threatened abortion: Secondary | ICD-10-CM | POA: Insufficient documentation

## 2021-11-06 DIAGNOSIS — Z3A01 Less than 8 weeks gestation of pregnancy: Secondary | ICD-10-CM | POA: Diagnosis not present

## 2021-11-06 DIAGNOSIS — Z3491 Encounter for supervision of normal pregnancy, unspecified, first trimester: Secondary | ICD-10-CM | POA: Diagnosis not present

## 2021-11-06 LAB — WET PREP, GENITAL
Sperm: NONE SEEN
Trich, Wet Prep: NONE SEEN
WBC, Wet Prep HPF POC: >=10 — AB (ref <10)
Yeast Wet Prep HPF POC: NONE SEEN

## 2021-11-06 LAB — CBC
HCT: 39.6 % (ref 36.0–46.0)
Hemoglobin: 13 g/dL (ref 12.0–15.0)
MCH: 26.6 pg (ref 26.0–34.0)
MCHC: 32.8 g/dL (ref 30.0–36.0)
MCV: 81 fL (ref 80.0–100.0)
Platelets: 244 10*3/uL (ref 150–400)
RBC: 4.89 MIL/uL (ref 3.87–5.11)
RDW: 14.5 % (ref 11.5–15.5)
WBC: 6.2 10*3/uL (ref 4.0–10.5)
nRBC: 0 % (ref 0.0–0.2)

## 2021-11-06 LAB — HCG, QUANTITATIVE, PREGNANCY: hCG, Beta Chain, Quant, S: 275 m[IU]/mL — ABNORMAL HIGH (ref <5)

## 2021-11-06 LAB — BETA HCG QUANT (REF LAB): hCG Quant: 258 m[IU]/mL

## 2021-11-06 NOTE — Telephone Encounter (Signed)
TC from pt with report of heavy bleeding. Positive UPT 11/05/21 with uncertain dating due to brown spotting w/ unclear LMP. Advised pt to seek care in MAU to evaluate for location and viability of pregnancy.

## 2021-11-06 NOTE — Discharge Instructions (Signed)

## 2021-11-06 NOTE — MAU Provider Note (Signed)
History     CSN: [id number]  Arrival date and time: 11/06/21 1904  Chief Complaint  Patient presents with   Vaginal Bleeding   HPI  Traci Ramirez is a 36 y.o. G3P1011 at [redacted]w[redacted]d who presents for evaluation of vaginal bleeding. Patient reports she has had bleeding since 6/11 and yesterday it has increased to more like a period. She has changed her pad twice today. She reports intermittent lower abdominal pain that she rates a 3 out of 10 and has not tried anything for the pain.  She denies any discharge and leaking of fluid. Denies any constipation, diarrhea or any urinary complaints.   OB History     Gravida  3   Para  1   Term  1   Preterm      AB  1   Living  1      SAB  1   IAB      Ectopic      Multiple  0   Live Births  1           Past Medical History:  Diagnosis Date   Anemia     Past Surgical History:  Procedure Laterality Date   NO PAST SURGERIES      Family History  Problem Relation Age of Onset   Lung cancer Mother    Brain cancer Mother     Social History   Tobacco Use   Smoking status: Never   Smokeless tobacco: Never  Vaping Use   Vaping Use: Never used  Substance Use Topics   Alcohol use: Never   Drug use: Never    Allergies: No Known Allergies  No medications prior to admission.    Review of Systems  Constitutional: Negative.  Negative for fatigue and fever.  HENT: Negative.    Respiratory: Negative.  Negative for shortness of breath.   Cardiovascular: Negative.  Negative for chest pain.  Gastrointestinal:  Positive for abdominal pain. Negative for constipation, diarrhea, nausea and vomiting.  Genitourinary:  Positive for vaginal bleeding. Negative for dysuria.  Neurological: Negative.  Negative for dizziness and headaches.   Physical Exam   Blood pressure 124/72, pulse 99, temperature 98.8 F (37.1 C), temperature source Oral, resp. rate 18, height 5\' 6"  (1.676 m), weight 116.8 kg, last menstrual period  09/23/2021, SpO2 99 %, not currently breastfeeding.  Patient Vitals for the past 24 hrs:  BP Temp Temp src Pulse Resp SpO2 Height Weight  11/06/21 2046 -- -- -- -- 18 -- -- --  11/06/21 1913 124/72 98.8 F (37.1 C) Oral 99 16 99 % 5\' 6"  (1.676 m) 116.8 kg    Physical Exam Vitals and nursing note reviewed.  Constitutional:      General: She is not in acute distress.    Appearance: She is well-developed.  HENT:     Head: Normocephalic.  Eyes:     Pupils: Pupils are equal, round, and reactive to light.  Cardiovascular:     Rate and Rhythm: Normal rate and regular rhythm.     Heart sounds: Normal heart sounds.  Pulmonary:     Effort: Pulmonary effort is normal. No respiratory distress.     Breath sounds: Normal breath sounds.  Abdominal:     General: Bowel sounds are normal. There is no distension.     Palpations: Abdomen is soft.     Tenderness: There is no abdominal tenderness.  Skin:    General: Skin is warm and dry.  Neurological:  Mental Status: She is alert and oriented to person, place, and time.  Psychiatric:        Mood and Affect: Mood normal.        Behavior: Behavior normal.        Thought Content: Thought content normal.        Judgment: Judgment normal.     MAU Course  Procedures  Results for orders placed or performed during the hospital encounter of 11/06/21 (from the past 24 hour(s))  CBC     Status: None   Collection Time: 11/06/21  7:35 PM  Result Value Ref Range   WBC 6.2 4.0 - 10.5 K/uL   RBC 4.89 3.87 - 5.11 MIL/uL   Hemoglobin 13.0 12.0 - 15.0 g/dL   HCT 39.6 36.0 - 46.0 %   MCV 81.0 80.0 - 100.0 fL   MCH 26.6 26.0 - 34.0 pg   MCHC 32.8 30.0 - 36.0 g/dL   RDW 14.5 11.5 - 15.5 %   Platelets 244 150 - 400 K/uL   nRBC 0.0 0.0 - 0.2 %  hCG, quantitative, pregnancy     Status: Abnormal   Collection Time: 11/06/21  7:35 PM  Result Value Ref Range   hCG, Beta Chain, Quant, S 275 (H) <5 mIU/mL  Wet prep, genital     Status: Abnormal    Collection Time: 11/06/21  7:43 PM  Result Value Ref Range   Yeast Wet Prep HPF POC NONE SEEN NONE SEEN   Trich, Wet Prep NONE SEEN NONE SEEN   Clue Cells Wet Prep HPF POC PRESENT (A) NONE SEEN   WBC, Wet Prep HPF POC >=10 (A) <10   Sperm NONE SEEN      US OB LESS THAN 14 WEEKS WITH OB TRANSVAGINAL  Result Date: 11/06/2021 CLINICAL DATA:  Pregnancy and bleeding. LMP: 09/23/2021 corresponding to an estimated gestational age of [redacted] weeks, 2 days. EXAM: OBSTETRIC <14 WK [place] AND TRANSVAGINAL OB US TECHNIQUE: Both transabdominal and transvaginal ultrasound examinations were performed for complete evaluation of the gestation as well as the maternal uterus, adnexal regions, and pelvic cul-de-sac. Transvaginal technique was performed to assess early pregnancy. COMPARISON:  None Available. FINDINGS: The uterus is anteverted. The uterus is slightly heterogeneous. A subcentimeter hypoechoic focus in the anterior myometrium may represent a fibroid. The endometrium measures 7 mm in thickness. There is a sac-like structure in the upper endometrium. No fetal pole or yolk sac identified. This may represent an early gestational sac, or a blighted ovum. A pseudogestation of an ectopic pregnancy is not entirely excluded. If this sac-like structure is a true gestational sac, the estimated gestational age based on mean sac diameter of 4 mm is 5 weeks, 1 day. The ovaries are unremarkable. There is a 2.3 x 1.2 x 1.5 cm cyst in the left adnexa. No significant free fluid in the pelvis. IMPRESSION: Sac-like structure within the endometrium as above. Follow-up with ultrasound in 7-11 days, or earlier if clinically indicated, recommended. Electronically Signed   By: Anner Crete M.D.   On: 11/06/2021 20:23     MDM Labs ordered and reviewed.   UA, UPT CBC, HCG ABO/Rh- A Pos Wet prep and gc/chlamydia US OB Comp Less 14 weeks with Transvaginal  Discussed with patient likely gestational sac in uterus and recommendation for  repeat ultrasound in 10-14 days. Patient agreeable to plan of care. CNM shared concerns for possible SAB given amount of bleeding and precautions reviewed.   Message sent to schedule patient for  viability u/s- unable to make appt in MAU.  Assessment and Plan   1. Normal intrauterine pregnancy on prenatal ultrasound in first trimester   2. Threatened miscarriage   3. [redacted] weeks gestation of pregnancy     -Discharge home in stable condition -Vaginal bleeding and pain precautions discussed -Patient advised to follow-up with [facility] in 2 weeks for repeat ultrasound -Patient may return to MAU as needed or if her condition were to change or worsen  Wende Mott, CNM 11/06/2021, 8:47 PM

## 2021-11-06 NOTE — MAU Note (Signed)
.  Palmira Stickle is a 36 y.o. at Unknown here in MAU reporting: bright red VB since 11/03/2021 which started slowly and yesterday it increased. Pt states she is wearing a pad, that she is had to change twice today due to mostly saturated. Pt states the bleeding is more than spotting and like a period flow. Pt report lower bilateral ABD cramping that is intermittent, started yesterday. Pt denies LOF, recent intercourse, and abnormal discharge. Pt went to [facility] yesterday for POCT preg and HCG testing, called there today for update on bleeding and was told to come in.  LMP: 09/23/2021 Onset of complaint: 11/03/21 Pain score: 3/10 Vitals:   11/06/21 1913  BP: 124/72  Pulse: 99  Resp: 16  Temp: 98.8 F (37.1 C)  SpO2: 99%      Lab orders placed from triage:

## 2021-11-07 ENCOUNTER — Telehealth: Payer: Self-pay | Admitting: *Deleted

## 2021-11-07 ENCOUNTER — Other Ambulatory Visit: Payer: Self-pay

## 2021-11-07 DIAGNOSIS — O200 Threatened abortion: Secondary | ICD-10-CM

## 2021-11-07 LAB — GC/CHLAMYDIA PROBE AMP (~~LOC~~) NOT AT ARMC
Chlamydia: NEGATIVE
Comment: NEGATIVE
Comment: NORMAL
Neisseria Gonorrhea: NEGATIVE

## 2021-11-07 NOTE — Telephone Encounter (Signed)
TC from pt concerning need for appt. Was seen in MAU. Pt understood that she needed to call [facility] for an appt in 7-11 days. Provider notes reviewed. Pt to be scheduled for [place] at [facility]. Request noted in Referral que. Clarification offered to pt. Pt advised MFM would call to schedule appt for her. Pt verbalized understanding.

## 2021-11-25 ENCOUNTER — Ambulatory Visit
Admission: RE | Admit: 2021-11-25 | Discharge: 2021-11-25 | Disposition: A | Discharge status: Home / Self Care | Payer: Medicaid Other | Source: Ambulatory Visit

## 2021-11-25 ENCOUNTER — Ambulatory Visit (INDEPENDENT_AMBULATORY_CARE_PROVIDER_SITE_OTHER): Payer: Medicaid Other | Admitting: Advanced Practice Midwife

## 2021-11-25 VITALS — BP 125/70 | HR 73 | Wt 256.6 lb

## 2021-11-25 DIAGNOSIS — O039 Complete or unspecified spontaneous abortion without complication: Secondary | ICD-10-CM

## 2021-11-25 DIAGNOSIS — O200 Threatened abortion: Secondary | ICD-10-CM | POA: Insufficient documentation

## 2021-11-26 LAB — BETA HCG QUANT (REF LAB): hCG Quant: <1 m[IU]/mL

## 2021-11-27 ENCOUNTER — Encounter: Payer: Self-pay | Admitting: Advanced Practice Midwife

## 2021-11-27 NOTE — Progress Notes (Signed)
S: 36 y.o. G3P1011 @[redacted]w[redacted]d  by LMP presents to [facility] for follow up ultrasound after MAU visit.  She initially presented on 11/06/21 with report of vaginal bleeding.  Hcg on 6/14 was 275 and a sac like structure was seen in the endometrium, likely a gestational sac.  She reports bleeding continued another week, then resolved.  She denies pain or bleeding today.      Review of Systems  Constitutional:  Negative for chills, fever and malaise/fatigue.  Eyes:  Negative for blurred vision.  Respiratory:  Negative for cough and shortness of breath.   Cardiovascular:  Negative for chest pain.  Gastrointestinal:  Negative for constipation, diarrhea, heartburn, nausea and vomiting.  Genitourinary:  Negative for dysuria, frequency and urgency.       No abdominal pain or vaginal bleeding  Musculoskeletal: Negative.   Neurological:  Negative for dizziness and headaches.  Psychiatric/Behavioral:  Negative for depression.     O: BP 125/70   Pulse 73   Wt 256 lb 9.6 oz (116.4 kg)   LMP 09/23/2021 (Exact Date)   BMI 41.42 kg/m  11/23/2021 OB Transvaginal  Result Date: 11/25/2021 CLINICAL DATA:  Viability. EXAM: TRANSVAGINAL OB ULTRASOUND TECHNIQUE: Transvaginal ultrasound was performed for complete evaluation of the gestation as well as the maternal uterus, adnexal regions, and pelvic cul-de-sac. COMPARISON:  None Available. FINDINGS: Intrauterine gestational sac: None Yolk sac:  Not Visualized. Embryo:  Not Visualized. Cardiac Activity: N/a Subchorionic hemorrhage:  None visualized. Maternal uterus/adnexae: RIGHT ovary appears normal. No mass or free fluid in the adjacent RIGHT adnexal region. Complex cyst within the LEFT ovary measuring 2.6 cm, possibly partially collapsed corpus luteum. No mass or free fluid in the adjacent LEFT adnexal region. Small amount of free fluid in the cul-de-sac. IMPRESSION: 1. No intrauterine pregnancy is identified on today's exam. This may indicate a recent spontaneous abortion given  the suggestion of a possible small intrauterine gestational demonstrated on the earlier ultrasound of 11/06/2021. This small sac-like structure is no longer visualized. 2. No evidence of ectopic pregnancy. 3. LEFT ovarian cyst measures 2.6 cm, possibly a corpus luteum. No followup imaging recommended. Note: This recommendation does not apply to premenarchal patients or to those with increased risk (genetic, family history, elevated tumor markers or other high-risk factors) of ovarian cancer. Reference: Radiology 2019 Nov; 293(2):359-371. Electronically Signed   By: 05-[birth year]1975 M.D.   On: 11/25/2021 16:00     VS reviewed, nursing note reviewed,  Constitutional: well developed, well nourished, no distress HEENT: normocephalic CV: normal rate Pulm/chest wall: normal effort Abdomen: soft Neuro: alert and oriented x 3 Skin: warm, dry Psych: affect normal  MDM: Reviewed today's 01/26/2022 and previously seen gestational sac is no longer visualized. This is consistent with an SAB and results were discussed with pt and her s/o in the office today. Pt and partner given opportunity to ask questions.  Hcg was drawn today to confirm and follow up will be based on these results.  I recommend a follow up appointment in 2-3 weeks to discuss pregnancy planning or prevention.  A:  1. Spontaneous miscarriage     P: D/C home Follow up 2-3 weeks or earlier for labs if hcg remains elevated   [place], CNM 10:44 AM

## 2021-12-03 ENCOUNTER — Other Ambulatory Visit: Payer: Medicaid Other

## 2021-12-19 ENCOUNTER — Ambulatory Visit: Payer: Medicaid Other | Admitting: Student

## 2022-03-02 IMAGING — US US OB LIMITED
1 series · 5 of 5 positions shown · non-contrast
Comparison: none

[Series 1: us ob limited · 5 of 5 slices shown]
[im 1/5]
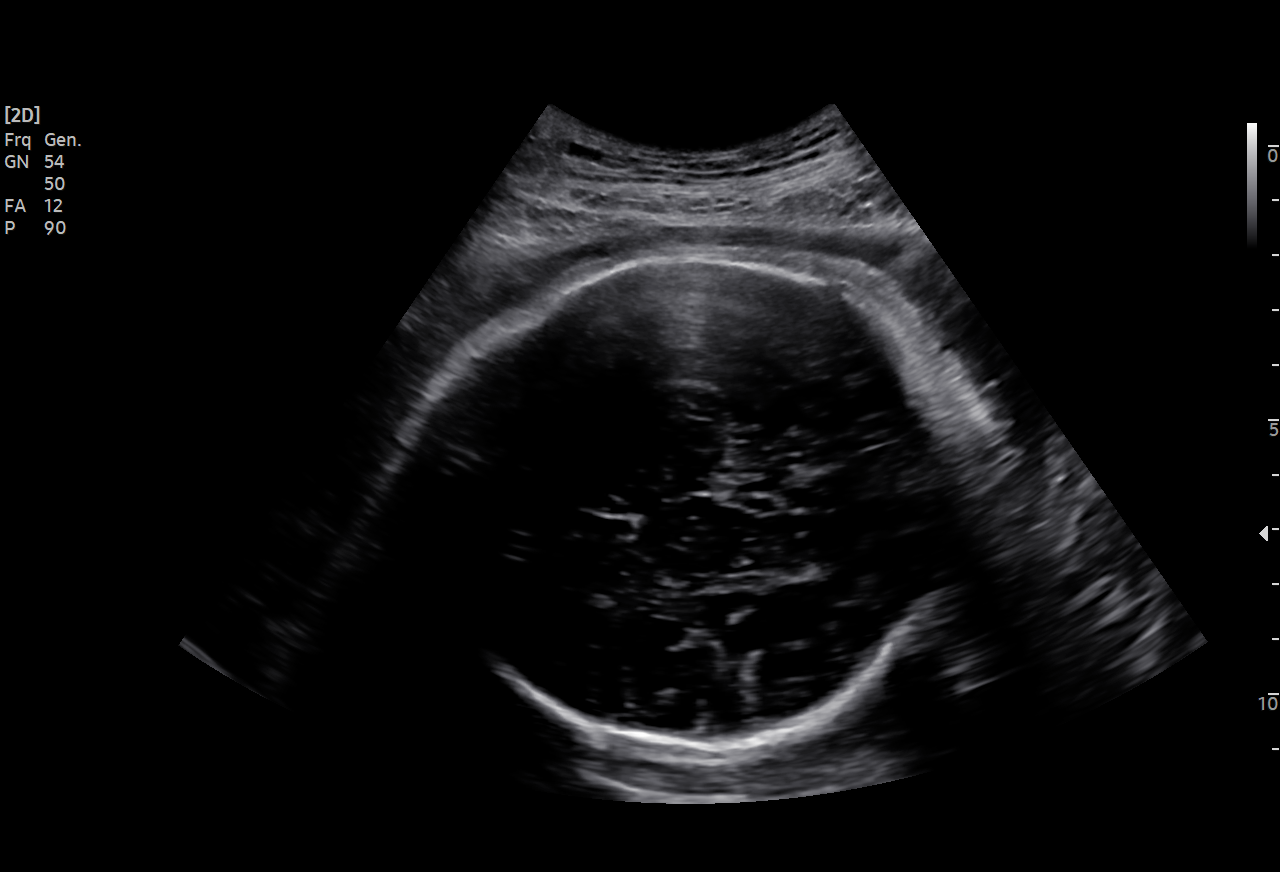
[im 2/5]
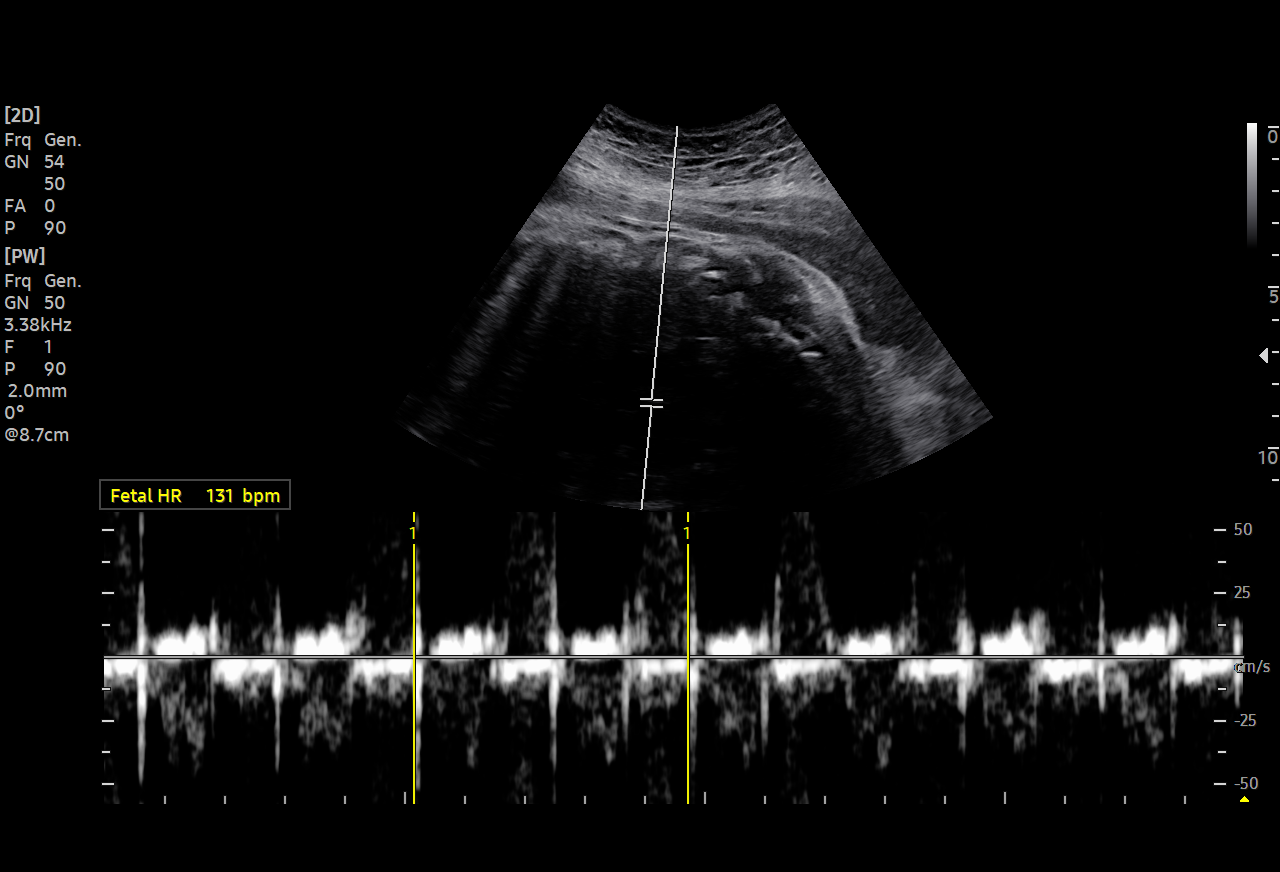
[im 3/5]
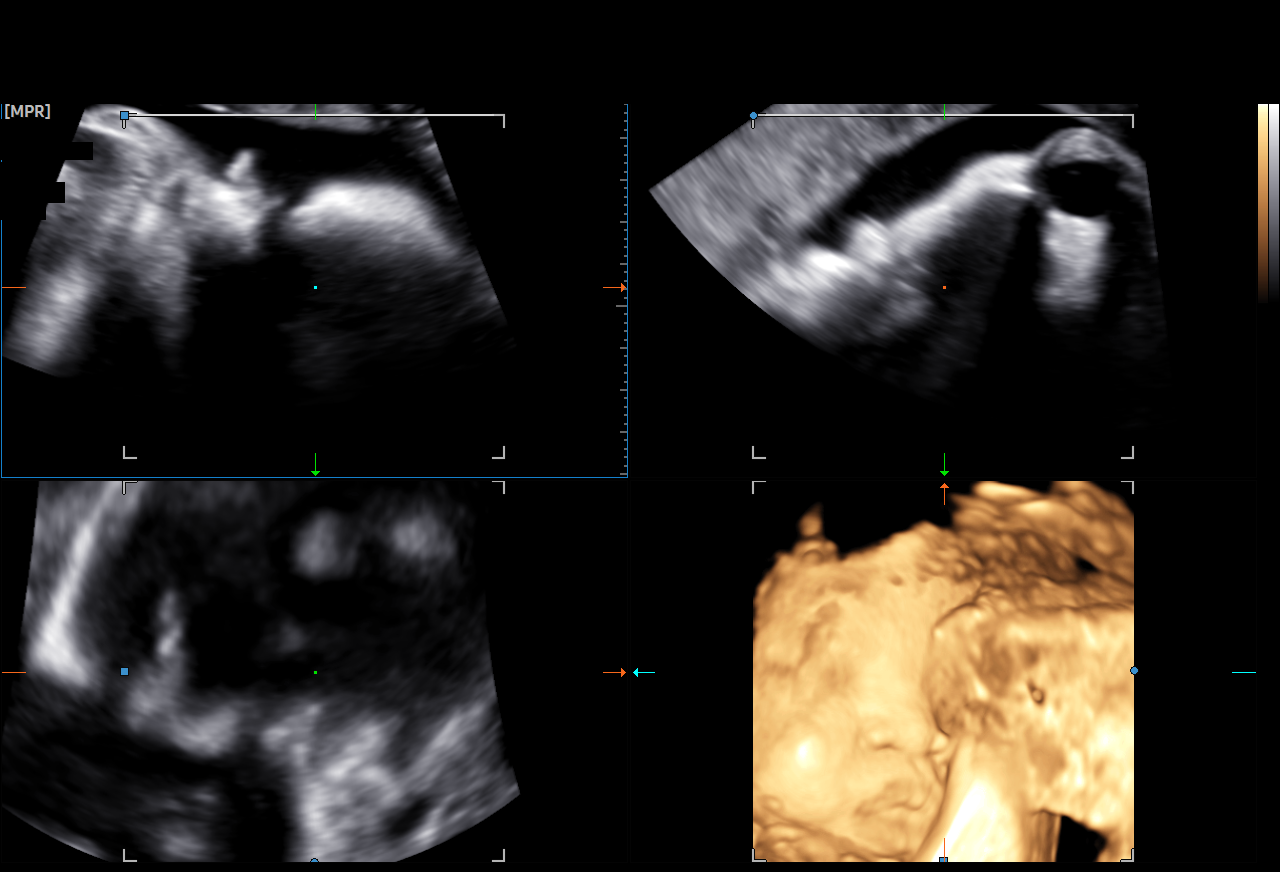
[im 4/5]
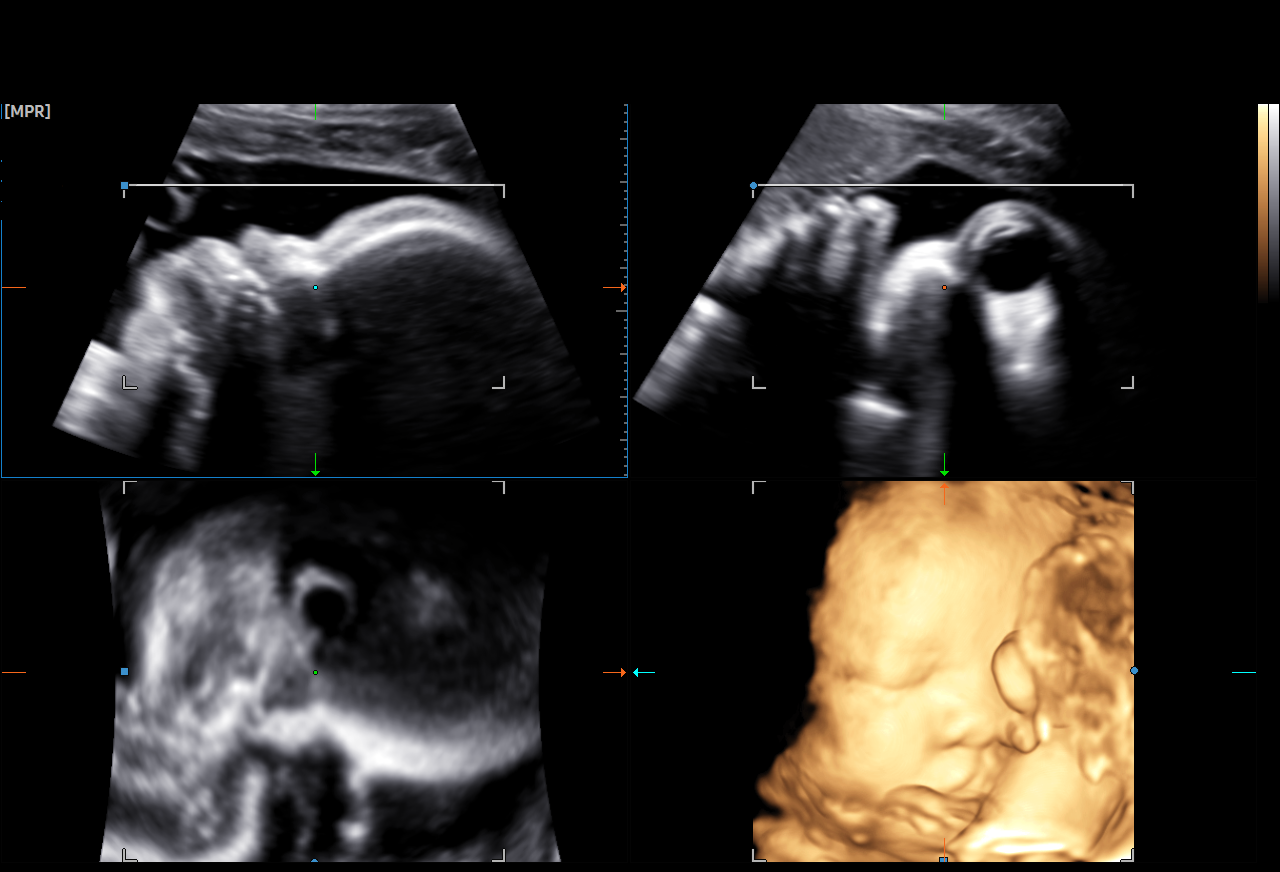
[im 5/5]
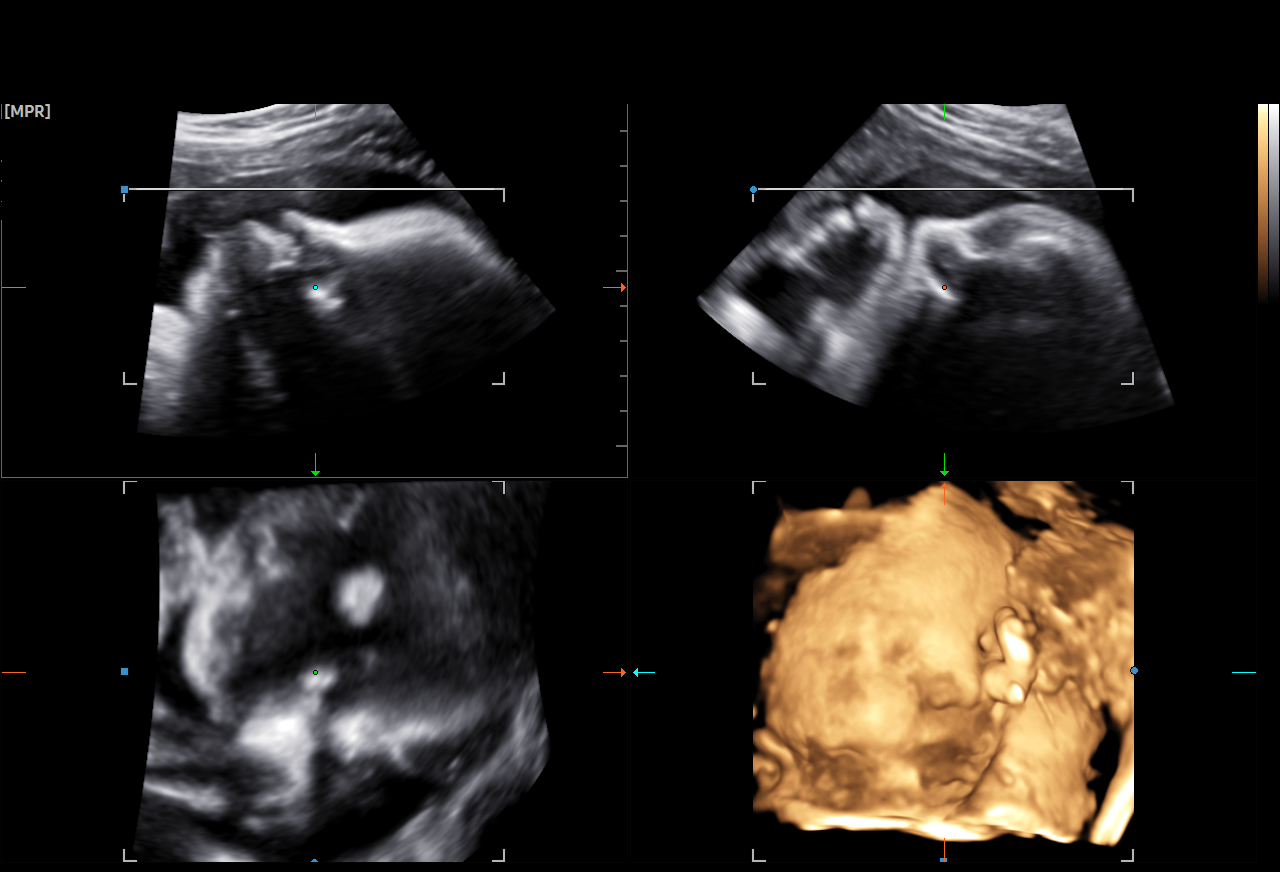

[5 of 5 positions shown; findings below may reference images not displayed]

[REDACTED]
                   [id number]

 1  [HOSPITAL]                         768[address].0     CHANNING ERIK

Indications

 37 weeks gestation of pregnancy
 Determine Fetal presentation by ultrasound
Fetal Evaluation

 Num Of Fetuses:         1
 Fetal Heart Rate(bpm):  131
 Cardiac Activity:       Observed
 Presentation:           Cephalic
OB History

 Gravidity:    2         Term:   0        Prem:   0        SAB:   1
 TOP:          0       Ectopic:  0        Living: 0
Gestational Age

 LMP:           37w 3d        Date:  07/25/19                 EDD:   04/30/20
 Best:          37w 3d     Det. By:  LMP  (07/25/19)          EDD:   04/30/20
Comments

 A limited ultrasound performed today shows that the fetus is
 in the vertex presentation.
Impression

 Vertex position by U/s
Recommendations

 OB U/S as clinically indicated

## 2022-03-05 ENCOUNTER — Ambulatory Visit (INDEPENDENT_AMBULATORY_CARE_PROVIDER_SITE_OTHER): Payer: Medicaid Other | Admitting: *Deleted

## 2022-03-05 VITALS — BP 107/74 | HR 82

## 2022-03-05 DIAGNOSIS — Z3201 Encounter for pregnancy test, result positive: Secondary | ICD-10-CM

## 2022-03-05 DIAGNOSIS — Z3200 Encounter for pregnancy test, result unknown: Secondary | ICD-10-CM

## 2022-03-05 LAB — POCT URINE PREGNANCY: Preg Test, Ur: POSITIVE — AB

## 2022-03-05 MED ORDER — PRENATAL 28-0.8 MG PO TABS: 1.0000 | ORAL_TABLET | Freq: Every day | ORAL | 12 refills | Status: AC

## 2022-03-05 NOTE — Progress Notes (Signed)
Agree with nurses's documentation of this patient's clinic encounter.  Jaclyn Carew L, MD  

## 2022-03-05 NOTE — Progress Notes (Signed)
Ms. Wish presents today for UPT. She has no unusual complaints. LMP:    OBJECTIVE: Appears well, in no apparent distress.  OB History     Gravida  3   Para  1   Term  1   Preterm      AB  1   Living  1      SAB  1   IAB      Ectopic      Multiple  0   Live Births  1          Home UPT Result: positive In-Office UPT result: positive I have reviewed the patient's medical, obstetrical, social, and family histories, and medications.   ASSESSMENT: Positive pregnancy test  PLAN Prenatal care to be completed in [place]. Pt is moving in 2 weeks. Advised to seek care there soon for [place] for viability and dating.  Given ectopic/ SAB precautions. Pt verbalized understanding.

## 2023-09-26 IMAGING — US US OB < 14 WEEKS - US OB TV
1 series · 15 of 28 positions shown · non-contrast
Comparison: None Available.

CLINICAL DATA: Pregnancy and bleeding. LMP: 09/23/2021
corresponding to an estimated gestational age of 6 weeks, 2 days.

EXAM:
OBSTETRIC <14 WK US AND TRANSVAGINAL OB US
TECHNIQUE: Both transabdominal and transvaginal ultrasound examinations were
performed for complete evaluation of the gestation as well as the
maternal uterus, adnexal regions, and pelvic cul-de-sac.
Transvaginal technique was performed to assess early pregnancy.

[Series 1: us ob < 14 weeks - us ob tv · 15 of 57 slices shown]
[im 1/57]
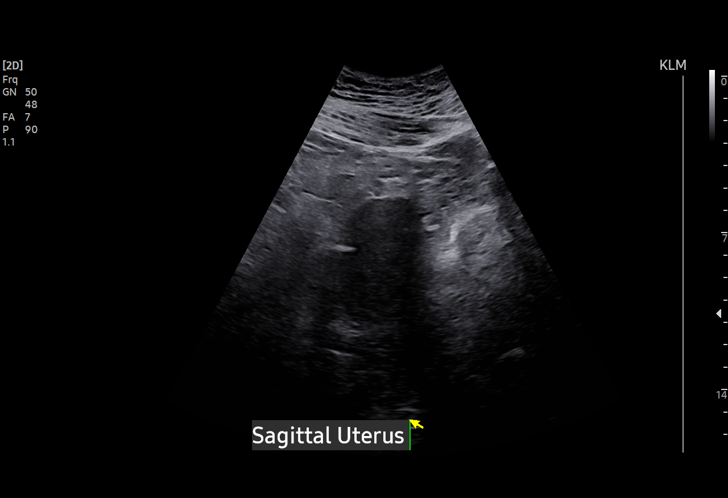
[im 5/57]
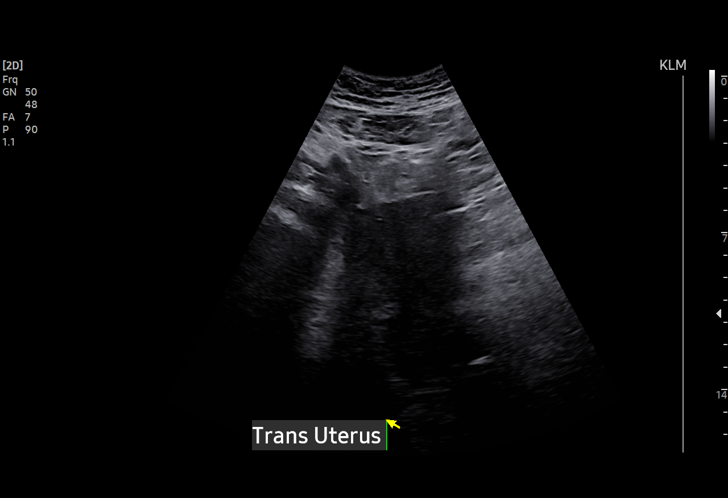
[im 9/57]
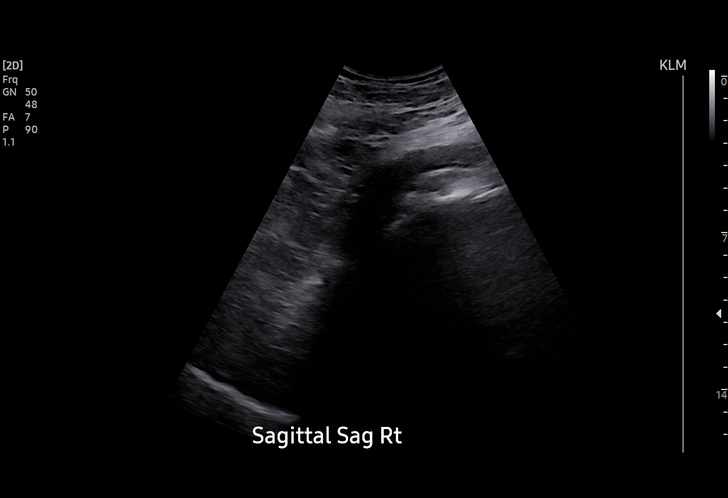
[im 13/57]
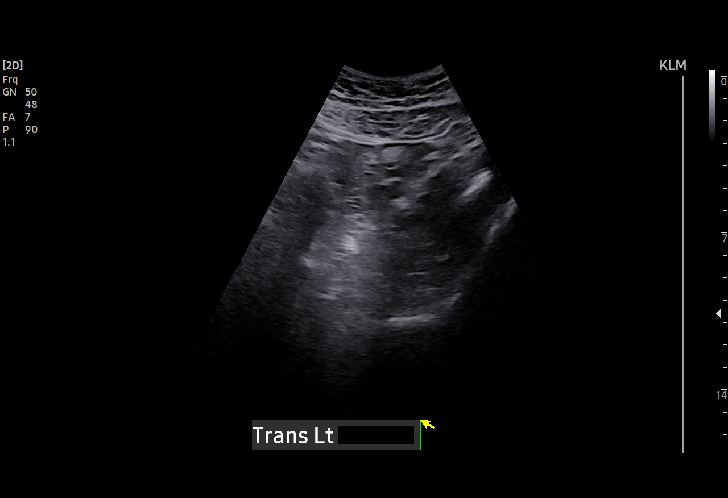
[im 17/57]
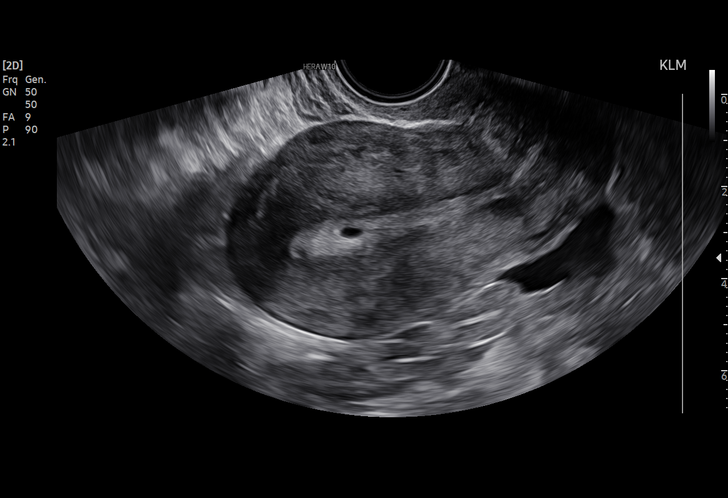
[im 21/57]
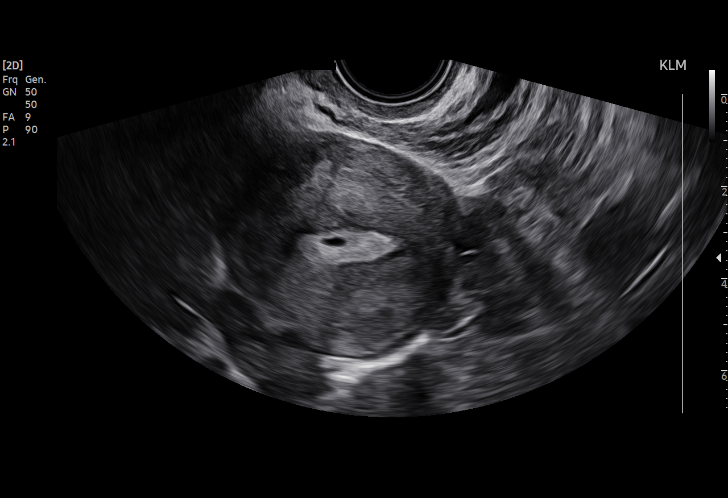
[im 25/57]
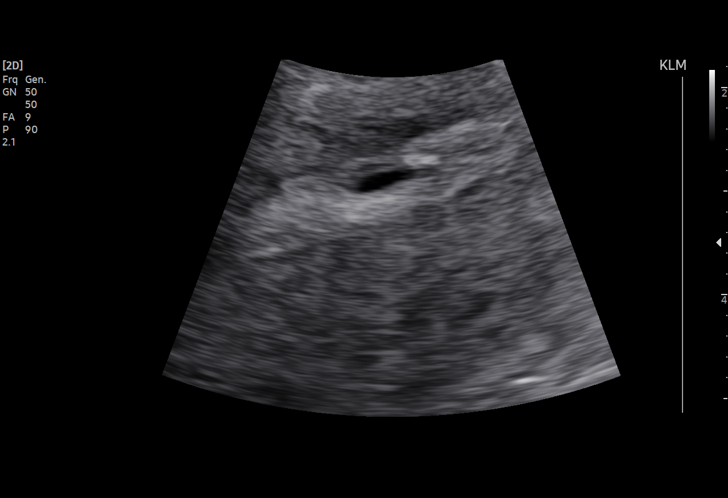
[im 30/57]
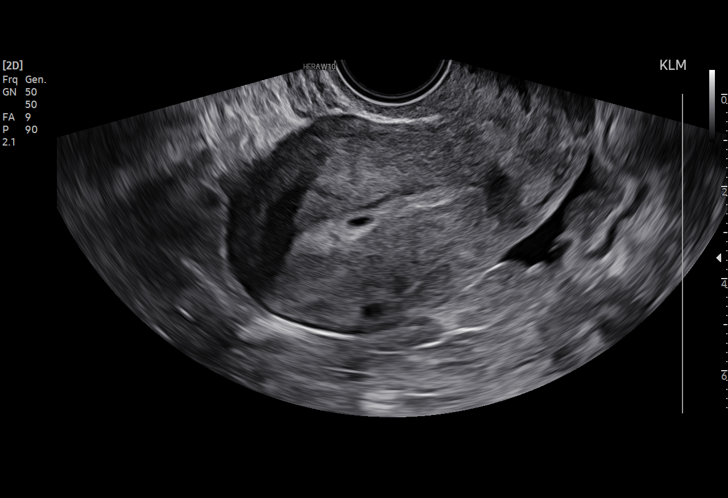
[im 32/57]
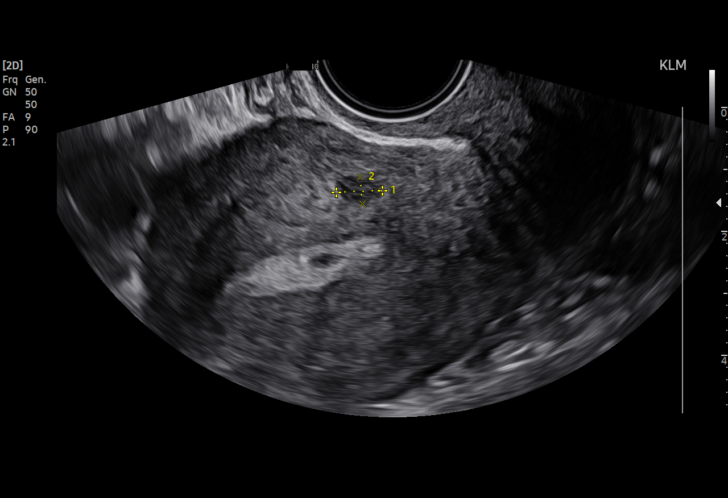
[im 36/57]
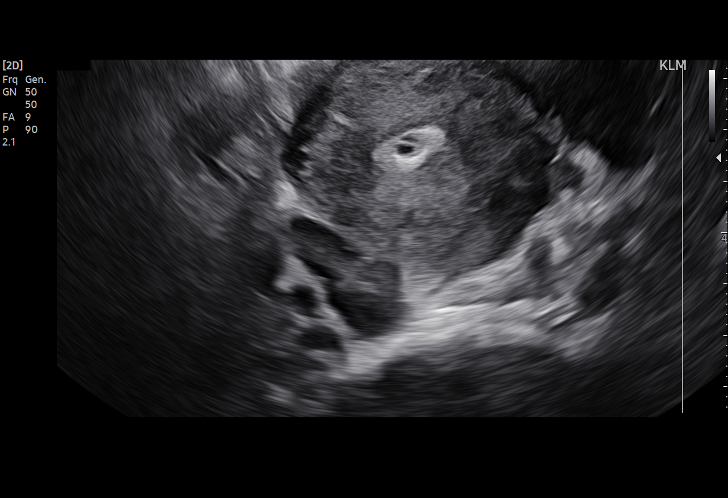
[im 40/57]
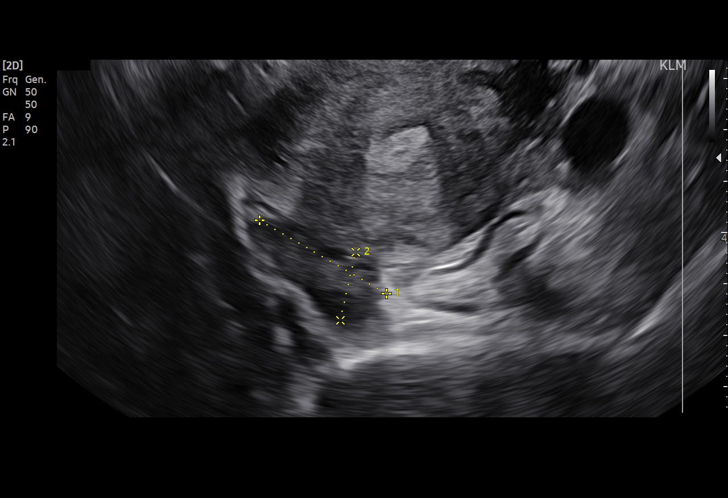
[im 44/57]
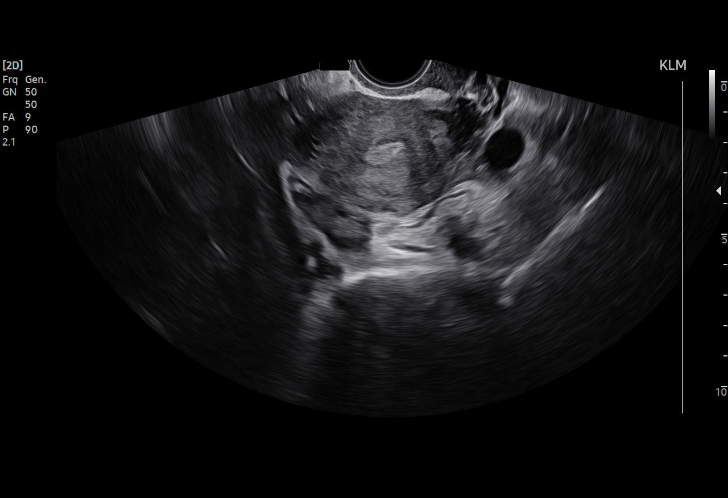
[im 48/57]
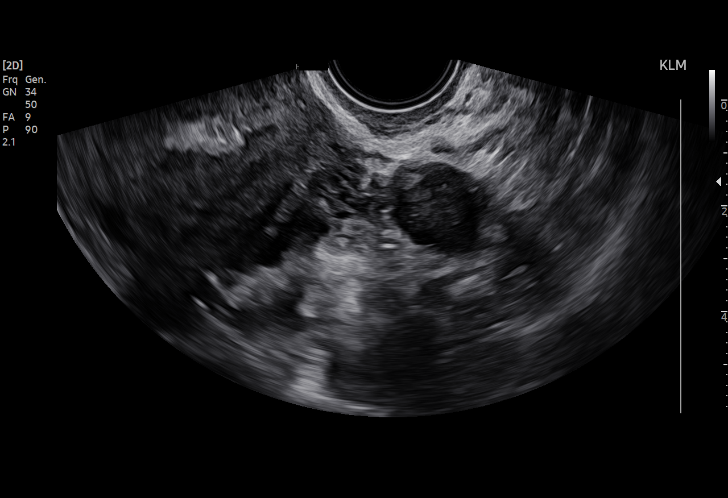
[im 52/57]
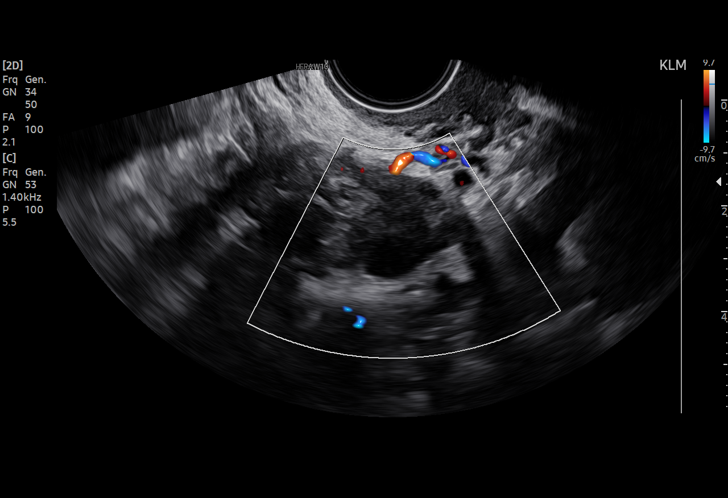
[im 57/57]
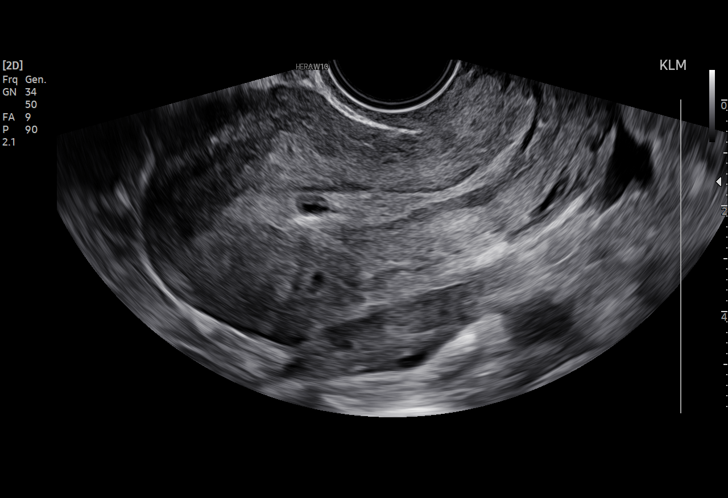

[15 of 28 positions shown; findings below may reference images not displayed]

FINDINGS: The uterus is anteverted. The uterus is slightly heterogeneous. A
subcentimeter hypoechoic focus in the anterior myometrium may
represent a fibroid.

The endometrium measures 7 mm in thickness. There is a sac-like
structure in the upper endometrium. No fetal pole or yolk sac
identified. This may represent an early gestational sac, or a
blighted ovum. A pseudogestation of an ectopic pregnancy is not
entirely excluded.

If this sac-like structure is a true gestational sac, the estimated
gestational age based on mean sac diameter of 4 mm is 5 weeks, 1
day.

The ovaries are unremarkable. There is a 2.3 x 1.2 x 1.5 cm cyst in
the left adnexa.

No significant free fluid in the pelvis.
IMPRESSION: Sac-like structure within the endometrium as above. Follow-up with
ultrasound in 7-11 days, or earlier if clinically indicated,
recommended.
